# Patient Record
Sex: Female | Born: 1980 | Race: Black or African American | Hispanic: No | Marital: Married | Smoking: Current every day smoker
Health system: Southern US, Community
[De-identification: ages and names within clinical notes are randomized; demographics above are authoritative.]

## PROBLEM LIST (undated history)

## (undated) ENCOUNTER — Inpatient Hospital Stay (HOSPITAL_COMMUNITY): Payer: Self-pay

## (undated) DIAGNOSIS — I10 Essential (primary) hypertension: Secondary | ICD-10-CM

## (undated) DIAGNOSIS — L732 Hidradenitis suppurativa: Secondary | ICD-10-CM

## (undated) DIAGNOSIS — G47419 Narcolepsy without cataplexy: Secondary | ICD-10-CM

## (undated) DIAGNOSIS — G4730 Sleep apnea, unspecified: Secondary | ICD-10-CM

## (undated) DIAGNOSIS — R87629 Unspecified abnormal cytological findings in specimens from vagina: Secondary | ICD-10-CM

## (undated) DIAGNOSIS — E039 Hypothyroidism, unspecified: Secondary | ICD-10-CM

## (undated) DIAGNOSIS — A4902 Methicillin resistant Staphylococcus aureus infection, unspecified site: Secondary | ICD-10-CM

## (undated) DIAGNOSIS — N390 Urinary tract infection, site not specified: Secondary | ICD-10-CM

## (undated) DIAGNOSIS — E559 Vitamin D deficiency, unspecified: Secondary | ICD-10-CM

## (undated) HISTORY — PX: GANGLION CYST EXCISION: SHX1691

## (undated) HISTORY — PX: CHOLECYSTECTOMY: SHX55

## (undated) HISTORY — PX: CARPAL TUNNEL RELEASE: SHX101

## (undated) HISTORY — PX: AXILLARY HIDRADENITIS EXCISION: SUR522

## (undated) HISTORY — PX: TONSILLECTOMY: SUR1361

## (undated) HISTORY — PX: LAPAROSCOPIC GASTRIC BANDING: SHX1100

---

## 2003-09-25 ENCOUNTER — Other Ambulatory Visit: Payer: Self-pay

## 2004-04-23 ENCOUNTER — Emergency Department: Payer: Self-pay | Admitting: Emergency Medicine

## 2004-06-24 ENCOUNTER — Other Ambulatory Visit: Payer: Self-pay

## 2004-06-24 ENCOUNTER — Emergency Department: Payer: Self-pay | Admitting: Emergency Medicine

## 2004-08-24 ENCOUNTER — Emergency Department: Payer: Self-pay | Admitting: Emergency Medicine

## 2005-01-23 ENCOUNTER — Emergency Department: Payer: Self-pay | Admitting: Unknown Physician Specialty

## 2005-06-20 ENCOUNTER — Emergency Department: Payer: Self-pay | Admitting: Emergency Medicine

## 2005-12-17 ENCOUNTER — Encounter: Payer: Self-pay | Admitting: Pulmonary Disease

## 2006-09-14 ENCOUNTER — Other Ambulatory Visit: Admission: RE | Admit: 2006-09-14 | Discharge: 2006-09-14 | Payer: Self-pay | Admitting: Gynecology

## 2006-11-19 ENCOUNTER — Encounter: Payer: Self-pay | Admitting: Pulmonary Disease

## 2006-11-20 ENCOUNTER — Encounter: Payer: Self-pay | Admitting: Pulmonary Disease

## 2007-02-15 ENCOUNTER — Other Ambulatory Visit: Admission: RE | Admit: 2007-02-15 | Discharge: 2007-02-15 | Payer: Self-pay | Admitting: Gynecology

## 2007-07-23 ENCOUNTER — Other Ambulatory Visit: Admission: RE | Admit: 2007-07-23 | Discharge: 2007-07-23 | Payer: Self-pay | Admitting: Gynecology

## 2007-11-30 ENCOUNTER — Ambulatory Visit: Payer: Self-pay | Admitting: Pulmonary Disease

## 2007-11-30 DIAGNOSIS — G4726 Circadian rhythm sleep disorder, shift work type: Secondary | ICD-10-CM | POA: Insufficient documentation

## 2007-11-30 DIAGNOSIS — G4710 Hypersomnia, unspecified: Secondary | ICD-10-CM | POA: Insufficient documentation

## 2007-11-30 DIAGNOSIS — G4733 Obstructive sleep apnea (adult) (pediatric): Secondary | ICD-10-CM

## 2007-12-01 ENCOUNTER — Encounter: Payer: Self-pay | Admitting: Pulmonary Disease

## 2007-12-13 ENCOUNTER — Encounter: Payer: Self-pay | Admitting: Pulmonary Disease

## 2007-12-20 ENCOUNTER — Other Ambulatory Visit: Admission: RE | Admit: 2007-12-20 | Discharge: 2007-12-20 | Payer: Self-pay | Admitting: Gynecology

## 2008-01-14 ENCOUNTER — Encounter (INDEPENDENT_AMBULATORY_CARE_PROVIDER_SITE_OTHER): Payer: Self-pay | Admitting: *Deleted

## 2008-02-15 ENCOUNTER — Encounter: Payer: Self-pay | Admitting: Pulmonary Disease

## 2008-03-23 ENCOUNTER — Ambulatory Visit: Payer: Self-pay | Admitting: Infectious Diseases

## 2008-03-23 DIAGNOSIS — L0291 Cutaneous abscess, unspecified: Secondary | ICD-10-CM | POA: Insufficient documentation

## 2008-03-23 DIAGNOSIS — L0390 Cellulitis, unspecified: Secondary | ICD-10-CM

## 2008-03-24 ENCOUNTER — Encounter: Payer: Self-pay | Admitting: Infectious Diseases

## 2008-05-05 ENCOUNTER — Inpatient Hospital Stay (HOSPITAL_COMMUNITY): Admission: EM | Admit: 2008-05-05 | Discharge: 2008-05-11 | Payer: Self-pay | Admitting: Internal Medicine

## 2008-05-10 ENCOUNTER — Encounter (INDEPENDENT_AMBULATORY_CARE_PROVIDER_SITE_OTHER): Payer: Self-pay | Admitting: General Surgery

## 2008-06-13 ENCOUNTER — Ambulatory Visit: Payer: Self-pay | Admitting: Infectious Diseases

## 2008-06-13 DIAGNOSIS — L723 Sebaceous cyst: Secondary | ICD-10-CM

## 2009-02-05 ENCOUNTER — Inpatient Hospital Stay (HOSPITAL_COMMUNITY): Admission: EM | Admit: 2009-02-05 | Discharge: 2009-02-07 | Payer: Self-pay | Admitting: Internal Medicine

## 2009-08-17 ENCOUNTER — Ambulatory Visit: Payer: Self-pay | Admitting: Physician Assistant

## 2009-08-17 ENCOUNTER — Inpatient Hospital Stay (HOSPITAL_COMMUNITY): Admission: AD | Admit: 2009-08-17 | Discharge: 2009-08-17 | Payer: Self-pay | Admitting: Obstetrics and Gynecology

## 2009-09-28 ENCOUNTER — Ambulatory Visit: Payer: Self-pay | Admitting: Gynecology

## 2009-09-28 ENCOUNTER — Inpatient Hospital Stay (HOSPITAL_COMMUNITY): Admission: AD | Admit: 2009-09-28 | Discharge: 2009-09-28 | Payer: Self-pay | Admitting: Obstetrics and Gynecology

## 2009-10-12 ENCOUNTER — Encounter: Payer: Self-pay | Admitting: Obstetrics and Gynecology

## 2009-10-12 ENCOUNTER — Inpatient Hospital Stay (HOSPITAL_COMMUNITY): Admission: RE | Admit: 2009-10-12 | Discharge: 2009-10-15 | Payer: Self-pay | Admitting: Obstetrics and Gynecology

## 2009-10-25 ENCOUNTER — Encounter (HOSPITAL_BASED_OUTPATIENT_CLINIC_OR_DEPARTMENT_OTHER): Admission: RE | Admit: 2009-10-25 | Discharge: 2009-12-19 | Payer: Self-pay | Admitting: General Surgery

## 2009-11-13 ENCOUNTER — Emergency Department (HOSPITAL_BASED_OUTPATIENT_CLINIC_OR_DEPARTMENT_OTHER): Admission: EM | Admit: 2009-11-13 | Discharge: 2009-11-13 | Payer: Self-pay | Admitting: Emergency Medicine

## 2010-01-10 ENCOUNTER — Emergency Department (HOSPITAL_BASED_OUTPATIENT_CLINIC_OR_DEPARTMENT_OTHER): Admission: EM | Admit: 2010-01-10 | Discharge: 2010-01-10 | Payer: Self-pay | Admitting: Emergency Medicine

## 2010-04-30 ENCOUNTER — Emergency Department (HOSPITAL_BASED_OUTPATIENT_CLINIC_OR_DEPARTMENT_OTHER)
Admission: EM | Admit: 2010-04-30 | Discharge: 2010-04-30 | Disposition: A | Discharge status: Home / Self Care | Payer: BC Managed Care – PPO | Attending: Emergency Medicine | Admitting: Emergency Medicine

## 2010-04-30 DIAGNOSIS — G43909 Migraine, unspecified, not intractable, without status migrainosus: Secondary | ICD-10-CM | POA: Insufficient documentation

## 2010-04-30 DIAGNOSIS — R209 Unspecified disturbances of skin sensation: Secondary | ICD-10-CM | POA: Insufficient documentation

## 2010-04-30 DIAGNOSIS — I10 Essential (primary) hypertension: Secondary | ICD-10-CM | POA: Insufficient documentation

## 2010-05-07 LAB — URINALYSIS, ROUTINE W REFLEX MICROSCOPIC
Ketones, ur: NEGATIVE mg/dL
Protein, ur: NEGATIVE mg/dL

## 2010-05-07 LAB — PREGNANCY, URINE: Preg Test, Ur: NEGATIVE

## 2010-05-07 LAB — URINE MICROSCOPIC-ADD ON

## 2010-05-10 LAB — COMPREHENSIVE METABOLIC PANEL
ALT: 14 U/L (ref 0–35)
AST: 19 U/L (ref 0–37)
Albumin: 2.9 g/dL — ABNORMAL LOW (ref 3.5–5.2)
Alkaline Phosphatase: 78 U/L (ref 39–117)
Calcium: 9.1 mg/dL (ref 8.4–10.5)
Creatinine, Ser: 0.59 mg/dL (ref 0.4–1.2)
GFR calc Af Amer: >60 mL/min (ref >60)
GFR calc non Af Amer: >60 mL/min (ref >60)
Glucose, Bld: 81 mg/dL (ref 70–99)
Potassium: 4 mEq/L (ref 3.5–5.1)
Total Bilirubin: 0.5 mg/dL (ref 0.3–1.2)
Total Protein: 6.2 g/dL (ref 6.0–8.3)

## 2010-05-10 LAB — CBC
HCT: 32.8 % — ABNORMAL LOW (ref 36.0–46.0)
Hemoglobin: 11.5 g/dL — ABNORMAL LOW (ref 12.0–15.0)
MCH: 28.5 pg (ref 26.0–34.0)
MCHC: 35.1 g/dL (ref 30.0–36.0)
Platelets: 144 10*3/uL — ABNORMAL LOW (ref 150–400)
RDW: 13.8 % (ref 11.5–15.5)
RDW: 14.2 % (ref 11.5–15.5)

## 2010-05-10 LAB — LACTATE DEHYDROGENASE: LDH: 140 U/L (ref 94–250)

## 2010-05-10 LAB — RPR: RPR Ser Ql: NONREACTIVE

## 2010-05-10 LAB — URIC ACID: Uric Acid, Serum: 4.3 mg/dL (ref 2.4–7.0)

## 2010-05-12 LAB — URINALYSIS, ROUTINE W REFLEX MICROSCOPIC
Nitrite: NEGATIVE
Protein, ur: NEGATIVE mg/dL
Specific Gravity, Urine: 1.02 (ref 1.005–1.030)
pH: 6 (ref 5.0–8.0)

## 2010-05-28 LAB — CBC
HCT: 36.3 % (ref 36.0–46.0)
HCT: 37.2 % (ref 36.0–46.0)
Hemoglobin: 12.6 g/dL (ref 12.0–15.0)
MCHC: 33.8 g/dL (ref 30.0–36.0)
MCHC: 33.9 g/dL (ref 30.0–36.0)
MCV: 80.1 fL (ref 78.0–100.0)
MCV: 80.2 fL (ref 78.0–100.0)
RBC: 4.42 MIL/uL (ref 3.87–5.11)
RBC: 4.53 MIL/uL (ref 3.87–5.11)
WBC: 10.8 10*3/uL — ABNORMAL HIGH (ref 4.0–10.5)

## 2010-05-28 LAB — TSH: TSH: 6.966 u[IU]/mL — ABNORMAL HIGH (ref 0.350–4.500)

## 2010-05-28 LAB — BASIC METABOLIC PANEL
BUN: 9 mg/dL (ref 6–23)
CO2: 25 mEq/L (ref 19–32)
Chloride: 109 mEq/L (ref 96–112)
Chloride: 110 mEq/L (ref 96–112)
Creatinine, Ser: 1 mg/dL (ref 0.4–1.2)
GFR calc Af Amer: >60 mL/min (ref >60)
GFR calc Af Amer: >60 mL/min (ref >60)
Potassium: 4.2 mEq/L (ref 3.5–5.1)
Sodium: 139 mEq/L (ref 135–145)

## 2010-05-28 LAB — COMPREHENSIVE METABOLIC PANEL
ALT: 11 U/L (ref 0–35)
Albumin: 3.4 g/dL — ABNORMAL LOW (ref 3.5–5.2)
BUN: 9 mg/dL (ref 6–23)
Creatinine, Ser: 0.85 mg/dL (ref 0.4–1.2)
GFR calc Af Amer: >60 mL/min (ref >60)
Glucose, Bld: 112 mg/dL — ABNORMAL HIGH (ref 70–99)
Sodium: 137 mEq/L (ref 135–145)

## 2010-05-28 LAB — SEDIMENTATION RATE: Sed Rate: 32 mm/hr — ABNORMAL HIGH (ref 0–22)

## 2010-05-28 LAB — DIFFERENTIAL
Eosinophils Absolute: 0.5 10*3/uL (ref 0.0–0.7)
Eosinophils Relative: 4 % (ref 0–5)
Lymphocytes Relative: 37 % (ref 12–46)
Lymphs Abs: 4.2 10*3/uL — ABNORMAL HIGH (ref 0.7–4.0)
Monocytes Absolute: 0.5 10*3/uL (ref 0.1–1.0)
Neutro Abs: 6.2 10*3/uL (ref 1.7–7.7)
Neutrophils Relative %: 54 % (ref 43–77)

## 2010-06-06 LAB — BASIC METABOLIC PANEL
BUN: 10 mg/dL (ref 6–23)
BUN: 9 mg/dL (ref 6–23)
CO2: 23 mEq/L (ref 19–32)
Chloride: 105 mEq/L (ref 96–112)
Creatinine, Ser: 1.01 mg/dL (ref 0.4–1.2)
GFR calc non Af Amer: >60 mL/min (ref >60)
Glucose, Bld: 86 mg/dL (ref 70–99)
Glucose, Bld: 87 mg/dL (ref 70–99)
Potassium: 3.4 mEq/L — ABNORMAL LOW (ref 3.5–5.1)

## 2010-06-06 LAB — CBC
HCT: 36.5 % (ref 36.0–46.0)
Hemoglobin: 12.3 g/dL (ref 12.0–15.0)
MCHC: 33.8 g/dL (ref 30.0–36.0)
MCV: 77.7 fL — ABNORMAL LOW (ref 78.0–100.0)
MCV: 78 fL (ref 78.0–100.0)
MCV: 78.5 fL (ref 78.0–100.0)
Platelets: 188 10*3/uL (ref 150–400)
Platelets: 211 10*3/uL (ref 150–400)
Platelets: 232 10*3/uL (ref 150–400)
RDW: 13.8 % (ref 11.5–15.5)
RDW: 15 % (ref 11.5–15.5)
RDW: 15 % (ref 11.5–15.5)
WBC: 10.4 10*3/uL (ref 4.0–10.5)
WBC: 8.4 10*3/uL (ref 4.0–10.5)

## 2010-06-06 LAB — COMPREHENSIVE METABOLIC PANEL
AST: 15 U/L (ref 0–37)
AST: 16 U/L (ref 0–37)
Albumin: 3.1 g/dL — ABNORMAL LOW (ref 3.5–5.2)
Albumin: 3.7 g/dL (ref 3.5–5.2)
Calcium: 9.1 mg/dL (ref 8.4–10.5)
Chloride: 107 mEq/L (ref 96–112)
Chloride: 108 mEq/L (ref 96–112)
Creatinine, Ser: 1.06 mg/dL (ref 0.4–1.2)
Creatinine, Ser: 1.06 mg/dL (ref 0.4–1.2)
GFR calc Af Amer: >60 mL/min (ref >60)
GFR calc Af Amer: >60 mL/min (ref >60)
Potassium: 4.6 mEq/L (ref 3.5–5.1)
Total Bilirubin: 0.6 mg/dL (ref 0.3–1.2)
Total Bilirubin: 0.7 mg/dL (ref 0.3–1.2)
Total Protein: 6.5 g/dL (ref 6.0–8.3)
Total Protein: 7.4 g/dL (ref 6.0–8.3)

## 2010-07-09 NOTE — Consult Note (Signed)
NAMEPRISCELLA, [name] [name]              ACCOUNT NO.:  [id number]   MEDICAL RECORD NO.:  [id number]          PATIENT TYPE:  INP   LOCATION:  1534                         FACILITY:  [facility]   PHYSICIAN:  John C. Madilyn Fireman, M.D.    DATE OF BIRTH:  11/21/1980   DATE OF CONSULTATION:  DATE OF DISCHARGE:                                 CONSULTATION   REFERRING PHYSICIAN:  Jackie Plum, M.D.   REASON FOR ADMISSION:  We were asked to see Ms. Dockham today in  consultation for right lower quadrant pain by Dr. Greggory Stallion Osei-Bonsu.   HISTORY OF PRESENT ILLNESS:  This is a very pleasant 30 year old female  who was admitted on May 05, 2008 with right lower quadrant pain and  was ruled out for appendicitis with a CT scan.  The patient reports that  her pain started on Thursday and it was continuous in nature, it  radiates up from her right lower quadrant to her right upper quadrant  and sometimes goes through to her back.  The patient vomited on Friday  and Saturday.  She denies any blood in her emesis.  Her stools are  regular and daily and dark brown in color.  She tells me that her  abdominal pain is worse on inspiration, but better with pressure against  her right side.  She takes an occasional Aleve.  She is on doxycycline  chronically for boils on her abdomen.   PAST MEDICAL HISTORY:  Significant for:  1. Hypertension.  2. Gastroesophageal reflux disease.  3. A right ovarian cyst that she had surgically removed.   CURRENT MEDICATIONS:  Doxycycline, Butalbital, Atenolol, she calls it a  GERD pill, although I think  it is Prilosec, hydrocodone, hydroxyzine,  fexofenadine.   She has an allergy to PENICILLIN and LEXAPRO.   REVIEW OF SYSTEMS:  Significant for chronic headaches.   SOCIAL HISTORY:  Negative for drugs, alcohol and tobacco.  She is  married.  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   FAMILY HISTORY:  Negative for IB, liver disease or colon cancer.  Her  maternal  grandmother does have an ulcer.   PHYSICAL EXAM:  She is alert and oriented, obese.  Temperature 99, pulse 65, respirations 18, blood pressure 101/68, heart  has a regular rate and rhythm.  LUNGS:  Clear.  ABDOMEN:  Obese, nondistended, tender in the right upper quadrant and  right lower quadrant.  She has good bowel sounds.  No obvious  organomegaly.  No peritoneal signs or rebound. Her lower abdomen does  have multiple boils in various stages of healing.   LABS:  Show a hemoglobin of 12.6, hematocrit 36.0, white count 8.4,  platelets 188,000.  Her BUN is 8, creatinine 1.06, potassium 4.6,  glucose 93.  LFTs are within normal range.  CT of her abdomen and pelvis  done May 05, 2008 was negative.   ASSESSMENT:  Dr. Dorena Cookey has seen and examined the patient, collected  a history and reviewed her chart.  His impression is that this is a 20-  year-old obese female with right lower quadrant  pain for the past 4  days.  We will check an abdominal and pelvic ultrasound to rule out  gallbladder disease or any more ovarian cysts.  Would consider a HIDA  scan if the abdominal ultrasound is negative.  She does not appear to  need urgent colonoscopy or endoscopy and could do these as an outpatient  if they need to be done in the future.  Thanks very much for this  consultation.  We will follow with you.      Stephani Police, PA    ______________________________  Everardo All Madilyn Fireman, M.D.    MLY/MEDQ  D:  05/08/2008  T:  05/08/2008  Job:  [id number]   cc:   Jackie Plum, M.D.  Fax: [phone]   Everardo All. Madilyn Fireman, M.D.  Fax: [phone]

## 2010-07-09 NOTE — Consult Note (Signed)
[name], [name]              ACCOUNT NO.:  [id number]   MEDICAL RECORD NO.:  [id number]          PATIENT TYPE:  INP   LOCATION:  1534                         FACILITY:  [facility]   PHYSICIAN:  Angelia Mould. Derrell Lolling, M.D.DATE OF BIRTH:  1981-01-30   DATE OF CONSULTATION:  05/09/2008  DATE OF DISCHARGE:                                 CONSULTATION   REASON FOR CONSULTATION:  Evaluate gallstones and abdominal pain.   HISTORY OF PRESENT ILLNESS:  This is a 30 year old black female who was  admitted to this hospital by Dr. Jackie Plum on May 05, 2008  because of abdominal pain and vomiting.   The patient states that she developed right-sided abdominal pain 5 days  ago.  She said it occurred in the morning before breakfast and was  rather sharp, she felt like she was being stabbed.  She endured the pain  throughout the first day.  The second day of the pain, 4 days ago, she  vomited three times.  Rosealee Albee she did not get better she went to see Dr.  Greggory Stallion Osei-Bonsu.  He was concerned about appendicitis and admitted her  to the hospital.  CT scan showed the appendix to be normal in all  regards and no other abnormalities were noted.  He started her on  antibiotics.   Since that time she has had pelvic ultrasound which showed no dramatic  abnormalities.  She has been seen by Dr. Dorena Cookey and Dr. Charlott Rakes.  She has had a gallbladder ultrasound, which shows small  gallstones within the gallbladder but the gallbladder is not thickened  and they said she had a negative sonographic Murphy's sign.  She then  had a hepatobiliary scan this morning which showed that the gallbladder  filled within 15 minutes but had a delayed and depressed ejection  fraction of 16.8% which was thought should have been greater than 30%.   The patient states that her pain has always been more in the right upper  than the right lower quadrant.  She states that she still has the pain,  although it is  better.  The nausea has resolved.  She ate a full lunch  today.  She denies any prior similar problems.  No history of liver  disease, pulmonary disease, cardiac disease or urologic problems.  I was  called to evaluate her.   PAST HISTORY:  1. Hypertension.  2. Morbid obesity.  3. Gastroesophageal reflux disease.  4. Laparoscopic ovarian cystectomy.  5. Prior abdominal wall soft tissue infections, none currently.   CURRENT MEDICATIONS:  1. Neurontin 600 mg b.i.d.  2. Tenormin 50 mg daily.  3. Hygroton 25 mg daily.  4. Potassium chloride.  5. Lovenox 80 mg daily.  6. Doxycycline 1 mg daily.  7. Protonix 40 mg daily p.o.  8. Toradol 30 mg every 8 hours p.r.n. pain.  9. Hydromorphone 2 mg IV p.r.n. pain.  10.Claritin mg daily, p.r.n.  11.Atarax 50 mg four times a day p.r.n.  12.Vicodin.   MEDICATION ALLERGIES:  PENICILLINS, LEXAPRO, FISH AND PINEAPPLE.   SOCIAL HISTORY:  Negative for drugs,  alcohol, tobacco.  She is married.  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   FAMILY HISTORY:  Negative for inflammatory bowel disease, liver disease  or colon cancer.  Paternal grandmother does have an ulcer.   REVIEW OF SYSTEMS:  She has been having normal bowel movements.  Denies  diarrhea.  Denies any history of jaundice or liver disease.  She said  that she weighs 350 pounds.   PHYSICAL EXAM:  Morbidly obese black female who appears to be in minimal  distress.  She is alert and oriented and cooperative.  Temperature 98.5.  Blood pressure 115/76, pulse 80, respiratory rate 20, oxygen saturation  100% on room air.  EYES:  Sclerae clear.  Extraocular movements intact.  EARS, NOSE, MOUTH AND THROAT:  Nose, lips, tongue and oropharynx are  without gross lesions.  NECK:  Supple, nontender.  No mass.  No jugular distention.  LUNGS:  Clear to auscultation.  No costovertebral angle tenderness.  HEART:  Regular rate and rhythm.  No murmurs.  Radial and femoral pulses  are  palpable.  ABDOMEN:  Morbidly obese.  She is tender in the right upper quadrant  much more than the right lower quadrant.  She guards but it seems more  voluntary than involuntary.  She has hypoactive bowel sounds.  She does  not appear obviously distended.  She has multiple scars on her lower  abdomen which appear to be from previous soft tissue infections.  I do  not see any active infection at this time.  EXTREMITIES:  She moves all four extremities well without pain or  deformity.  NEUROLOGIC:  No gross motor sensory deficits.   ADMISSION DATA:  Her CT scan, gallbladder ultrasound and hepatobiliary  scan are discussed above.  Urine pregnancy test is negative.  Complete  metabolic panel is normal essentially.  Albumin 3.1.  CBC reveals white  blood cell count 8400 and hemoglobin of 12.6.   ASSESSMENT:  1. Right upper quadrant pain and gallstones.  Her physical findings      seem to be out of proportion to her laboratory and radiographic      findings, but there does not appear to be any other good reason to      explain her symptoms other than gallstones.  I suspect that it is      most likely her gallbladder causing her symptoms. I cannot rule out      an obscure cause of pain such as infarcted omentum, perihepatic      adhesions, or ruptured ovarian cyst (doubt).  2. I do not think there is any evidence of acute cholecystitis by      ultrasound or hepatobiliary scan.  3. Morbid obesity.  4. Status post laparoscopic ovarian cystectomy.  5. Prior abdominal wall soft tissue infections.   PLAN:  1. I had a long discussion with the patient about the fact that most      likely this was due to her gallbladder, but I could not be certain.      I told her that we really did not have any other good explanation      for this and that probably she should have a cholecystectomy at      some point.  2. It was her strong desire to stay in the hospital and have the      cholecystectomy  because she states that she still has pain,      although her appetite has returned  and the nausea has resolved.   For this reason we are going to go ahead and try to get her on the  schedule for laparoscopic cholecystectomy with cholangiogram, possible  conversion to open cholecystectomy tomorrow afternoon.   I have discussed the indications and details of surgery with her.  Risks  and complications have been outlined, including but not limited to,  bleeding, infection, conversion to open laparotomy, injury to adjacent  organs such as the stomach or colon or liver with major reconstructive  surgery, bile leak, wound problems, cardiac, pulmonary and  thromboembolic problems.  She seems to understand these issues well.  At  this time all of her questions are answered.  She is in full agreement  with this plan.   I have discussed her care with Dr. Greggory Stallion Osei-Bonsu and he agrees as  well.      Angelia Mould. Derrell Lolling, M.D.  Electronically Signed     HMI/MEDQ  D:  05/09/2008  T:  05/09/2008  Job:  [id number]   cc:   Shirley Friar, MD  Fax: [phone]   Jackie Plum, M.D.  Fax: [phone]

## 2010-07-09 NOTE — Op Note (Signed)
[name], [name]              ACCOUNT NO.:  [id number]   MEDICAL RECORD NO.:  [id number]          PATIENT TYPE:  INP   LOCATION:  1534                         [facility]ACILITY:  [facility]   PHYSICIAN:  Angelia Mould. Derrell Lolling, M.D.DATE OF BIRTH:  02-23-1981   DATE OF PROCEDURE:  05/10/2008  DATE OF DISCHARGE:                               OPERATIVE REPORT   PREOPERATIVE DIAGNOSES:  Gallstones and biliary dyskinesia.   POSTOPERATIVE DIAGNOSES:  1. Gallstones and biliary dyskinesia.  2. Infarcted omentum.   OPERATION PERFORMED:  Laparoscopic cholecystectomy, laparoscopic partial  omentectomy.   SURGEON:  Dr. Claud Kelp   FIRST ASSISTANT:  Dr. Consuello Bossier.   OPERATIVE INDICATIONS:  This is a 30 year old black female who was  admitted to this hospital on March 12 because abdominal pain and  vomiting.  This started 5 days before today, occurred somewhat suddenly,  it felt she was being stabbed, with vomiting three times on the first  day of the pain.  She had a CT scan the next day because of concern for  appendicitis but there was no evidence of appendicitis or abnormality  noted on the CT scan.  She was admitted and started on antibiotics and  pain medication.  A pelvic ultrasound showed no dramatic abnormalities.  Gallbladder ultrasound showed small stones within the gallbladder but no  wall thickening.  Hepatobiliary scan showed the gallbladder filled but  had a significantly depressed ejection fraction.  I was asked to see the  patient.  By the time I saw her yesterday, she was enjoying lunch but  still complained of significant pain in the right upper quadrant.  On  exam, she did not look toxic or ill but did have tenderness in the right  upper quadrant.  I felt that her pain and tenderness was out of  proportion to her laboratory and radiographic findings, but had no  better explanation and so offered laparoscopy and cholecystectomy.  The  patient said that the pain was fairly  significant and really did not  want to go home.  She is brought to operating room today after being  n.p.o. after midnight.   OPERATIVE FINDINGS:  The gallbladder did look somewhat chronically  inflamed, was a little bit thick-walled.  The cystic duct was very tiny  and I really could not intubate with the cholangiogram catheter and so  had to abandon the cholangiogram.  The most interesting finding was  about a 3 cm x6 cm piece of clearly infarcted omentum in the right upper  quadrant.  There was no evidence of any other infarction of omentum or  any other abnormality of the abdomen and pelvis that I could detect.  I  felt that infarction omentum might be more likely be the cause of her  pain than her gallbladder.   OPERATIVE TECHNIQUE:  Following the induction of general endotracheal  anesthesia the patient's abdomen was prepped and draped in a sterile  fashion.  Intravenous antibiotics were given.  The surgical time-out was  held identifying correct patient and correct procedure.  0.5% Marcaine  with epinephrine was used as a local  infiltration anesthetic.   A 12-mm optical port was placed in the right rectus sheath about 4 cm  above the umbilicus.  This entry was uneventful, atraumatic and without  bleeding.  Pneumoperitoneum was created.  Video cam was inserted with  findings as described above.  An 11 mm port was placed the subxiphoid  region and two 5 mm ports placed in the right upper quadrant.  We  inspected the liver and gallbladder.  We inspected the area of infarcted  omentum.  We inspected the left upper quadrant and the left lobe of the  liver, stomach and spleen looked fine.  We inspected the lower abdomen  and pelvis and the omentum looked fine but was quite large and obscured  the rest of the GI tract partly.   We elevated the infarcted omentum and took photographs of it.  We found  that there was a couple of attachments of clearly healthy omentum to  this and we  divided these using the harmonic scalpel and then placed the  infarcted piece of omentum in a specimen bag and removed and sent it to  the lab.  There was no bleeding.   We then grabbed the gallbladder and elevated it.  There were moderate  adhesions to the body and infundibulum of the gallbladder which we took  down.  We then dissected out the cystic duct and cystic artery.  The  cystic artery was secured with multiple metal clips and divided.  We  isolated the cystic duct.  We made two separate areas of incision in the  cystic duct and tried to intubated with a cholangiogram catheter.  The  lumen was very narrow and tiny and we could not do that and so we  abandoned the cholangiogram, secured the cystic duct with four metal  clips and divided it.  The gallbladder was dissected from its bed with  electrocautery,  placed  in a specimen bag and removed from the  operative field.  We made one hole in the gallbladder and spilled some  bile but no stones.  After removing the gallbladder, we irrigated the  right upper quadrant, right sympathic space and right subphrenic space  with about 2 liters of saline until the irrigation fluid was completely  clear.  We inspected the bed of the gallbladder.  There was no bleeding.  We inspected the area of the control of the cystic duct and artery.  There was no bleeding or bile whatsoever.  The remaining omentum was  placed back up in the gallbladder bed.  The trocars were removed under  direct vision.  Pneumoperitoneum was released.  The skin incisions were  closed with subcuticular sutures of 4-0 Monocryl and Steri-Strips.  Clean bandages were placed and the patient taken recovery room in stable  condition.  Estimated blood loss was about 15-20 mL.  Complications  none.  Sponge, needles counts were correct.   SUMMARY:  Shunt the spleen thanks      Haywood M. Derrell Lolling, M.D.  Electronically Signed     HMI/MEDQ  D:  05/10/2008  T:  05/11/2008   Job:  [id number]   cc:   Shirley Friar, MD  Fax: [phone]   Jackie Plum, M.D.  Fax: [phone]

## 2010-07-09 NOTE — Discharge Summary (Signed)
[name] [name], [name] [name]              ACCOUNT NO.:  [id number]   MEDICAL RECORD NO.:  [id number]          PATIENT TYPE:  INP   LOCATION:  1534                         FACILITY:  [facility]   PHYSICIAN:  Jackie Plum, M.D.DATE OF BIRTH:  [birth year]   DATE OF ADMISSION:  05/05/2008  DATE OF DISCHARGE:  05/11/2008                               DISCHARGE SUMMARY   DISCHARGE DIAGNOSES:  1. Abdominal pain due to combination of chronic cholecystitis and      omental infarction, status post laparoscopic cholecystectomy and      laparoscopic partial omentectomy by Dr. Claud Kelp on May 10, 2008.  2. History of migraine headaches.  3. Hypertension.  4. Gastroesophageal reflux disease.  5. Morbid obesity.  6. Status post cholecystectomy and status post ovarian cyst removal.   HOSPITAL COURSE:  The patient is a 30 year old African American lady who  was admitted to the hospital on May 05, 2008 after presenting with  abdominal pain which was initially felt to be right lower quadrant in  origin but subsequently was noted to involve also the right upper  quadrant area.  There was no nausea or vomiting.  The patient was  admitted to the hospital and a CT scan was obtained which ruled out  concern for appendicitis.  She was seen by Dr. Madilyn Fireman of [facility] and ultrasound was done.  The ultrasound showed  polycystic ovaries.  This was followed up by a HIDA scan which was  borderline positive.  General surgical consult as obtained on May 09, 2008 and after careful review of the patient's tests, there was no clear  etiology for her symptoms and possibly secondary to gallstones that were  noted in her ultrasound report.  The patient was therefore sent for  laparoscopic surgery and was noted to have chronic cholecystitis and a  partial omental infarction.  The patient underwent cholecystectomy and  partial omentectomy with significant improvement overnight.  At this  point the  patient is pain free from her abdominal pain with no upper GI  symptoms and she is tolerating her meals when I saw her this morning.  She is hemodynamically stable with blood pressure of 110/70, pulse of 96  by radial pulse, respirations 18, temperature of 99.5 as well as oxygen  saturation of 98% on room air.  The patient is comfortable, she is  feeling better and ready for discharge home today.  She is now being  discharged with __________present.  Neck supple. No JVD.  Lungs clear to  auscultation.  Cardiac regular, no gallops.  Abdomen laparoscopic  incision points noted with no evidence of secondary infection.  Abdomen  is soft with bowel sounds present.  Mild tenderness around the  __________area.  There was benign __________present.  Normal bowel  sounds.  Extremities have trace edema, no cyanosis.  Patient is alert  and oriented x3 and she is discharged in satisfactory condition today.   DISCHARGE LABORATORY DATA:  White count 8.4, hemoglobin 12.6, hematocrit  36, MCV 79.7, platelet count 188,000.  Sodium 138, potassium 4.6,  chloride 107, CO2  28, glucose 93, BUN 8, creatinine 1.06.  Alkaline  phosphatase 33, AST 16, ALT 14, total protein 6.5, albumin 3.1, calcium  8.6.   DISCHARGE MEDICATIONS:  The patient is going to resume the following  admission medications as previously:  1. Gabapentin 600 mg b.i.d.  2. Pepcid 20 mg b.i.d.  3. Atenolol/chlorthalidone 50/75, one tablet daily.  4. Protonix 40 mg daily.  Her new medications are:  1. Cipro 400 mg p.o. q.12h. for 7 days.  2. Vicodin 5/500 one tablet q.4h. p.r.n.  3. Atarax 50 mg q.8h. p.r.n.  4. Claritin 10 mg daily p.r.n.   DISPOSITION:  Patient is discharged in stable, satisfactory condition.   FOLLOWUP:  She will follow up with Dr. Derrell Lolling in 3 weeks.  Follow with  Dr. Julio Sicks at [facility] in 2 weeks.      Jackie Plum, M.D.  Electronically Signed     GO/MEDQ  D:  05/11/2008  T:  05/11/2008   Job:  [id number]   cc:   Angelia Mould. Derrell Lolling, M.D.  [address]  [place]  [place] [address]   Everardo All. Madilyn Fireman, M.D.  Fax: [phone]

## 2010-09-30 ENCOUNTER — Emergency Department (HOSPITAL_BASED_OUTPATIENT_CLINIC_OR_DEPARTMENT_OTHER)
Admission: EM | Admit: 2010-09-30 | Discharge: 2010-09-30 | Disposition: A | Discharge status: Home / Self Care | Payer: BC Managed Care – PPO | Attending: Emergency Medicine | Admitting: Emergency Medicine

## 2010-09-30 ENCOUNTER — Encounter: Payer: Self-pay | Admitting: *Deleted

## 2010-09-30 DIAGNOSIS — G43909 Migraine, unspecified, not intractable, without status migrainosus: Secondary | ICD-10-CM | POA: Insufficient documentation

## 2010-09-30 DIAGNOSIS — R51 Headache: Secondary | ICD-10-CM

## 2010-09-30 MED ORDER — KETOROLAC TROMETHAMINE 60 MG/2ML IM SOLN: 60.0000 mg | Freq: Once | INTRAMUSCULAR | Status: DC

## 2010-09-30 MED ORDER — PROMETHAZINE HCL 25 MG/ML IJ SOLN
25.0000 mg | Freq: Once | INTRAMUSCULAR | Status: AC
  Administered 2010-09-30: 25 mg via INTRAVENOUS

## 2010-09-30 MED ORDER — KETOROLAC TROMETHAMINE 30 MG/ML IJ SOLN
30.0000 mg | Freq: Once | INTRAMUSCULAR | Status: AC
  Administered 2010-09-30: 30 mg via INTRAVENOUS

## 2010-09-30 MED ORDER — METHYLPREDNISOLONE SODIUM SUCC 125 MG IJ SOLR
250.0000 mg | Freq: Once | INTRAMUSCULAR | Status: AC
  Administered 2010-09-30: 250 mg via INTRAVENOUS
  Filled 2010-09-30: qty 4

## 2010-09-30 MED ORDER — DIPHENHYDRAMINE HCL 50 MG/ML IJ SOLN
25.0000 mg | Freq: Once | INTRAMUSCULAR | Status: AC
  Administered 2010-09-30: 25 mg via INTRAVENOUS

## 2010-09-30 MED ORDER — KETOROLAC TROMETHAMINE 30 MG/ML IJ SOLN
INTRAMUSCULAR | Status: AC
  Filled 2010-09-30: qty 1

## 2010-09-30 MED ORDER — DIPHENHYDRAMINE HCL 50 MG/ML IJ SOLN
25.0000 mg | Freq: Once | INTRAMUSCULAR | Status: DC
  Filled 2010-09-30: qty 1

## 2010-09-30 MED ORDER — PROMETHAZINE HCL 25 MG/ML IJ SOLN
25.0000 mg | Freq: Once | INTRAMUSCULAR | Status: DC
  Filled 2010-09-30: qty 1

## 2010-09-30 NOTE — ED Provider Notes (Addendum)
History     CSN: [id number] Arrival date & time: 09/30/2010  2:01 AM  Chief Complaint  Patient presents with  . Migraine   HPI Comments: Patient presents with a two-day history of headache. She does have a history of migraines and states that this is similar to her past migraines. She has some associated nausea but no vomiting. No fevers at home. No focal weakness or numbness or other neurologic deficits. She's tried her Topamax and her Maxalt home without relief. She previously been on Fioricet but it upsets her stomach so her doctor told her not to take it anymore. She has also used Vicodin in the past but did not have any at home at this time. Patient's headache goes from the top of her head down the posterior aspect of her head and neck. It is worsened by light.  Patient is a 30 y.o. female presenting with migraine. The history is provided by the patient.  Migraine This is a recurrent problem. The current episode started 2 days ago. The problem occurs constantly. The problem has not changed since onset.Associated symptoms include headaches. Pertinent negatives include no chest pain, no abdominal pain and no shortness of breath. The symptoms are relieved by nothing. The treatment provided no relief.    Past Medical History  Diagnosis Date  . Migraine     Past Surgical History  Procedure Date  . Laparoscopic gastric banding   . Tonsillectomy   . Cholecystectomy     No family history on file.  History  Substance Use Topics  . Smoking status: Not on file  . Smokeless tobacco: Not on file  . Alcohol Use:     OB History    Grav Para Term Preterm Abortions TAB SAB Ect Mult Living                  Review of Systems  Constitutional: Negative.  Negative for fever and chills.  Eyes: Negative.  Negative for discharge and redness.  Respiratory: Negative.  Negative for cough and shortness of breath.   Cardiovascular: Negative.  Negative for chest pain.  Gastrointestinal: Positive  for nausea. Negative for vomiting, abdominal pain and diarrhea.  Genitourinary: Negative.  Negative for dysuria and vaginal discharge.  Musculoskeletal: Negative.  Negative for back pain.  Skin: Negative.  Negative for color change and rash.  Neurological: Positive for headaches. Negative for syncope.  Hematological: Negative.  Negative for adenopathy.  Psychiatric/Behavioral: Negative.  Negative for confusion.  All other systems reviewed and are negative.    Physical Exam  BP 141/82  Pulse 77  Temp(Src) 97.8 F (36.6 C) (Oral)  Resp 18  SpO2 100%  Physical Exam  Constitutional: She is oriented to person, place, and time. She appears well-developed and well-nourished.  Non-toxic appearance. She does not have a sickly appearance.  HENT:  Head: Normocephalic and atraumatic.  Eyes: Conjunctivae, EOM and lids are normal. Pupils are equal, round, and reactive to light. No scleral icterus.  Neck: Trachea normal and normal range of motion. Neck supple.  Cardiovascular: Regular rhythm and normal heart sounds.   Pulmonary/Chest: Effort normal and breath sounds normal.  Abdominal: Soft. Normal appearance. There is no tenderness. There is no CVA tenderness.  Musculoskeletal: Normal range of motion.  Neurological: She is alert and oriented to person, place, and time. She has normal strength.  Skin: Skin is warm, dry and intact. No rash noted.  Psychiatric: She has a normal mood and affect. Her behavior is normal. Judgment and  thought content normal.    ED Course  Procedures  MDM Patient appears to have a headache that is typical for her with her migraines. She does not have signs of infection such as fevers to indicate meningitis. It was not a sudden onset headache to suggest subarachnoid hemorrhage. Will treat patient's pain and nausea and reassess her.       Nat Christen, MD 09/30/10 [phone]  Patient reassessed after the dose of Solu-Medrol was added to her previous medications  and she now has resolution of her headache and would like to go home at this time.  Nat Christen, MD 09/30/10 [phone]

## 2010-09-30 NOTE — ED Notes (Signed)
Pt began having migraine 2 days ago. Hx of migraines. Pt has taken topamax, maxalt.

## 2010-10-30 ENCOUNTER — Emergency Department (HOSPITAL_BASED_OUTPATIENT_CLINIC_OR_DEPARTMENT_OTHER)
Admission: EM | Admit: 2010-10-30 | Discharge: 2010-10-30 | Disposition: A | Discharge status: Home / Self Care | Payer: BC Managed Care – PPO | Attending: Emergency Medicine | Admitting: Emergency Medicine

## 2010-10-30 ENCOUNTER — Encounter (HOSPITAL_BASED_OUTPATIENT_CLINIC_OR_DEPARTMENT_OTHER): Payer: Self-pay | Admitting: *Deleted

## 2010-10-30 DIAGNOSIS — F17200 Nicotine dependence, unspecified, uncomplicated: Secondary | ICD-10-CM | POA: Insufficient documentation

## 2010-10-30 DIAGNOSIS — G43909 Migraine, unspecified, not intractable, without status migrainosus: Secondary | ICD-10-CM | POA: Insufficient documentation

## 2010-10-30 MED ORDER — KETOROLAC TROMETHAMINE 30 MG/ML IJ SOLN
30.0000 mg | Freq: Once | INTRAMUSCULAR | Status: AC
  Administered 2010-10-30: 30 mg via INTRAVENOUS
  Filled 2010-10-30: qty 1

## 2010-10-30 MED ORDER — METHYLPREDNISOLONE SODIUM SUCC 125 MG IJ SOLR
125.0000 mg | Freq: Once | INTRAMUSCULAR | Status: AC
  Administered 2010-10-30: 125 mg via INTRAVENOUS
  Filled 2010-10-30: qty 2

## 2010-10-30 MED ORDER — DIPHENHYDRAMINE HCL 50 MG/ML IJ SOLN
25.0000 mg | Freq: Once | INTRAMUSCULAR | Status: AC
  Administered 2010-10-30: 50 mg via INTRAVENOUS
  Filled 2010-10-30: qty 1

## 2010-10-30 MED ORDER — PROMETHAZINE HCL 25 MG/ML IJ SOLN
12.5000 mg | Freq: Once | INTRAMUSCULAR | Status: AC
  Administered 2010-10-30: 25 mg via INTRAVENOUS
  Filled 2010-10-30: qty 1

## 2010-10-30 NOTE — ED Notes (Signed)
Migraine with nausea and sensitivity to light/noise x3 days. Taking topamax and other home meds without relief.

## 2010-10-30 NOTE — ED Provider Notes (Signed)
History     CSN: [id number] Arrival date & time: 10/30/2010  1:44 AM  Chief Complaint  Patient presents with  . Migraine   HPI Pt with history of chronic migraines presents with typical migraine headache, severe throbbing R sided headache, nausea, and photophobia. Not controlled with home meds.  Past Medical History  Diagnosis Date  . Migraine     Past Surgical History  Procedure Date  . Laparoscopic gastric banding   . Tonsillectomy   . Cholecystectomy     No family history on file.  History  Substance Use Topics  . Smoking status: Current Everyday Smoker  . Smokeless tobacco: Not on file  . Alcohol Use: No    OB History    Grav Para Term Preterm Abortions TAB SAB Ect Mult Living                  Review of Systems All other systems reviewed and are negative except as noted in HPI.   Physical Exam  BP 127/82  Pulse 95  Temp(Src) 98.8 F (37.1 C) (Oral)  Resp 18  Ht 5\' 10"  (1.778 m)  Wt 315 lb (142.883 kg)  BMI 45.20 kg/m2  SpO2 100%  LMP 08/15/2010  Physical Exam  Nursing note and vitals reviewed. Constitutional: She is oriented to person, place, and time. She appears well-developed and well-nourished.  HENT:  Head: Normocephalic and atraumatic.  Eyes: EOM are normal. Pupils are equal, round, and reactive to light.  Neck: Normal range of motion. Neck supple.  Cardiovascular: Normal rate, normal heart sounds and intact distal pulses.   Pulmonary/Chest: Effort normal and breath sounds normal.  Abdominal: Bowel sounds are normal. She exhibits no distension. There is no tenderness.  Musculoskeletal: Normal range of motion. She exhibits no edema and no tenderness.  Neurological: She is alert and oriented to person, place, and time. No cranial nerve deficit.  Skin: Skin is warm and dry. No rash noted.  Psychiatric: She has a normal mood and affect.    ED Course  Procedures  MDM Pt feeling much better and ready go to home. Given toradol, solumedrol and  phenergan.       Charles B. Bernette Mayers, MD 10/30/10 [phone]

## 2010-10-30 NOTE — ED Notes (Signed)
Warm blankets given.

## 2010-11-08 ENCOUNTER — Emergency Department (HOSPITAL_BASED_OUTPATIENT_CLINIC_OR_DEPARTMENT_OTHER)
Admission: EM | Admit: 2010-11-08 | Discharge: 2010-11-09 | Disposition: A | Discharge status: Home / Self Care | Payer: BC Managed Care – PPO | Attending: Emergency Medicine | Admitting: Emergency Medicine

## 2010-11-08 ENCOUNTER — Encounter (HOSPITAL_BASED_OUTPATIENT_CLINIC_OR_DEPARTMENT_OTHER): Payer: Self-pay | Admitting: *Deleted

## 2010-11-08 DIAGNOSIS — R51 Headache: Secondary | ICD-10-CM

## 2010-11-08 DIAGNOSIS — F17200 Nicotine dependence, unspecified, uncomplicated: Secondary | ICD-10-CM | POA: Insufficient documentation

## 2010-11-08 MED ORDER — DIPHENHYDRAMINE HCL 50 MG/ML IJ SOLN
25.0000 mg | Freq: Once | INTRAMUSCULAR | Status: AC
  Administered 2010-11-09: 25 mg via INTRAVENOUS
  Filled 2010-11-08: qty 1

## 2010-11-08 MED ORDER — PROMETHAZINE HCL 25 MG/ML IJ SOLN
25.0000 mg | Freq: Once | INTRAMUSCULAR | Status: AC
  Administered 2010-11-09: 25 mg via INTRAVENOUS
  Filled 2010-11-08: qty 1

## 2010-11-08 MED ORDER — METOCLOPRAMIDE HCL 5 MG/ML IJ SOLN
10.0000 mg | Freq: Once | INTRAMUSCULAR | Status: AC
  Administered 2010-11-09: 10 mg via INTRAVENOUS
  Filled 2010-11-08: qty 2

## 2010-11-08 NOTE — ED Notes (Signed)
Pt c/o of sinus pain/pressure and states that she has tenderness to palpation of sinuses.  Upon exam R eardrum appears to be red and nasal mucosa red as well.  Pt denies nasal congestion/drainage.  C/o R ear pain and pressure over sinuses and radiating to back of head.

## 2010-11-08 NOTE — ED Notes (Signed)
Headache x 2 weeks. Has been here and her MD for tx. Temporary relief but pain comes back.

## 2010-11-09 MED ORDER — SODIUM CHLORIDE 0.9 % IV SOLN
INTRAVENOUS | Status: DC
  Administered 2010-11-09: via INTRAVENOUS

## 2010-11-09 NOTE — ED Provider Notes (Signed)
History     CSN: [id number] Arrival date & time: 11/08/2010 11:05 PM   Chief Complaint  Patient presents with  . Headache     (Include location/radiation/quality/duration/timing/severity/associated sxs/prior treatment) Patient is a 30 y.o. female presenting with headaches. The history is provided by the patient.  Headache  This is a new problem. The current episode started more than 1 week ago. The problem occurs constantly. The problem has not changed since onset.The headache is associated with nothing. The pain is located in the right unilateral region. The quality of the pain is described as throbbing. The pain is moderate. The pain radiates to the right neck. Associated symptoms include nausea. Pertinent negatives include no fever, no chest pressure, no palpitations, no syncope, no shortness of breath and no vomiting. She has tried oral narcotic analgesics for the symptoms. The treatment provided mild relief.   Has h/o of MHAs and this feels similar to those. No neck stiffness or congestion. No rash or sick contacts. No waekness or numbness. No trouble walking or speaking  Past Medical History  Diagnosis Date  . Migraine      Past Surgical History  Procedure Date  . Laparoscopic gastric banding   . Tonsillectomy   . Cholecystectomy     No family history on file.  History  Substance Use Topics  . Smoking status: Current Everyday Smoker  . Smokeless tobacco: Not on file  . Alcohol Use: No    OB History    Grav Para Term Preterm Abortions TAB SAB Ect Mult Living                  Review of Systems  Constitutional: Negative for fever and chills.  HENT: Negative for neck pain and neck stiffness.   Eyes: Negative for pain.  Respiratory: Negative for shortness of breath.   Cardiovascular: Negative for palpitations and syncope.  Gastrointestinal: Positive for nausea. Negative for vomiting and abdominal pain.  Genitourinary: Negative for dysuria.  Musculoskeletal:  Negative for back pain.  Skin: Negative for rash.  Neurological: Positive for headaches.  All other systems reviewed and are negative.    Allergies  Fish allergy; Escitalopram oxalate; Penicillins; and Pineapple  Home Medications   Current Outpatient Rx  Name Route Sig Dispense Refill  . BUPROPION HCL (XL) 150 MG PO TB24 Oral Take 150 mg by mouth daily.      Marland Kitchen BUTALBITAL-APAP-CAFF-COD 50-325-40-30 MG PO CAPS Oral Take 1 capsule by mouth every 4 (four) hours as needed. migraine    . ONE-DAILY MULTI VITAMINS PO TABS Oral Take 1 tablet by mouth daily.      Marland Kitchen RIZATRIPTAN BENZOATE 10 MG PO TABS Oral Take 10 mg by mouth as needed. For migraine. May repeat in 2 hours if needed    . TOPIRAMATE 100 MG PO TABS Oral Take 100 mg by mouth 2 (two) times daily.        Physical Exam    BP 127/95  Pulse 80  Temp(Src) 98.1 F (36.7 C) (Oral)  Resp 20  SpO2 99%  LMP 08/15/2010  Physical Exam  Constitutional: She is oriented to person, place, and time. She appears well-developed and well-nourished.  HENT:  Head: Normocephalic and atraumatic.  Eyes: Conjunctivae and EOM are normal. Pupils are equal, round, and reactive to light.  Neck: Full passive range of motion without pain. Neck supple. No thyromegaly present.       No meningismus  Cardiovascular: Normal rate, regular rhythm, S1 normal, S2 normal and intact  distal pulses.   Pulmonary/Chest: Effort normal and breath sounds normal.  Abdominal: Soft. Bowel sounds are normal. There is no tenderness. There is no CVA tenderness.  Musculoskeletal: Normal range of motion.  Neurological: She is alert and oriented to person, place, and time. She has normal strength and normal reflexes. No cranial nerve deficit or sensory deficit. She displays a negative Romberg sign. GCS eye subscore is 4. GCS verbal subscore is 5. GCS motor subscore is 6.       Normal Gait  Skin: Skin is warm and dry. No rash noted. No cyanosis. Nails show no clubbing.    Psychiatric: She has a normal mood and affect. Her speech is normal and behavior is normal.    ED Course  Procedures  Results for orders placed during the hospital encounter of 01/10/10  PREGNANCY, URINE      Component Value Range   Preg Test, Ur       Value: NEGATIVE            THE SENSITIVITY OF THIS     METHODOLOGY IS >24 mIU/mL  URINALYSIS, ROUTINE W REFLEX MICROSCOPIC      Component Value Range   Color, Urine YELLOW  YELLOW    Appearance CLEAR  CLEAR    Specific Gravity, Urine 1.013  1.005 - 1.030    pH 7.0  5.0 - 8.0    Glucose, UA NEGATIVE  NEGATIVE (mg/dL)   Hgb urine dipstick LARGE (*) NEGATIVE    Bilirubin Urine NEGATIVE  NEGATIVE    Ketones, ur NEGATIVE  NEGATIVE (mg/dL)   Protein, ur NEGATIVE  NEGATIVE (mg/dL)   Urobilinogen, UA 0.2  0.0 - 1.0 (mg/dL)   Nitrite NEGATIVE  NEGATIVE    Leukocytes, UA TRACE (*) NEGATIVE   URINE MICROSCOPIC-ADD ON      Component Value Range   Squamous Epithelial / LPF RARE  RARE    WBC, UA 0-2  <3 (WBC/hpf)   RBC / HPF 7-10  <3 (RBC/hpf)   Bacteria, UA RARE  RARE    No results found.   No diagnosis found.   MDM  HA no deficits, given HA cocktail and on recheck at 12:49 remains no neuro deficits and is stable for d/c home. Plan PCP follow up.        Sunnie Nielsen, MD 11/09/10 [phone]

## 2010-12-10 ENCOUNTER — Encounter (HOSPITAL_BASED_OUTPATIENT_CLINIC_OR_DEPARTMENT_OTHER): Payer: Self-pay | Admitting: *Deleted

## 2010-12-10 ENCOUNTER — Emergency Department (HOSPITAL_BASED_OUTPATIENT_CLINIC_OR_DEPARTMENT_OTHER)
Admission: EM | Admit: 2010-12-10 | Discharge: 2010-12-10 | Disposition: A | Discharge status: Home / Self Care | Payer: BC Managed Care – PPO | Attending: Emergency Medicine | Admitting: Emergency Medicine

## 2010-12-10 DIAGNOSIS — R51 Headache: Secondary | ICD-10-CM | POA: Insufficient documentation

## 2010-12-10 DIAGNOSIS — Z79899 Other long term (current) drug therapy: Secondary | ICD-10-CM | POA: Insufficient documentation

## 2010-12-10 DIAGNOSIS — F17200 Nicotine dependence, unspecified, uncomplicated: Secondary | ICD-10-CM | POA: Insufficient documentation

## 2010-12-10 MED ORDER — KETOROLAC TROMETHAMINE 30 MG/ML IJ SOLN
30.0000 mg | Freq: Once | INTRAMUSCULAR | Status: AC
  Administered 2010-12-10: 30 mg via INTRAVENOUS
  Filled 2010-12-10: qty 1

## 2010-12-10 MED ORDER — METOCLOPRAMIDE HCL 5 MG/ML IJ SOLN
10.0000 mg | Freq: Once | INTRAMUSCULAR | Status: AC
  Administered 2010-12-10: 10 mg via INTRAVENOUS
  Filled 2010-12-10: qty 2

## 2010-12-10 MED ORDER — DIPHENHYDRAMINE HCL 50 MG/ML IJ SOLN
25.0000 mg | Freq: Once | INTRAMUSCULAR | Status: AC
  Administered 2010-12-10: 25 mg via INTRAVENOUS
  Filled 2010-12-10: qty 1

## 2010-12-10 MED ORDER — DEXAMETHASONE SODIUM PHOSPHATE 10 MG/ML IJ SOLN
10.0000 mg | Freq: Once | INTRAMUSCULAR | Status: AC
  Administered 2010-12-10: 10 mg via INTRAVENOUS
  Filled 2010-12-10: qty 1

## 2010-12-10 MED ORDER — SODIUM CHLORIDE 0.9 % IV BOLUS (SEPSIS)
1000.0000 mL | Freq: Once | INTRAVENOUS | Status: AC
  Administered 2010-12-10 (×2): 1000 mL via INTRAVENOUS

## 2010-12-10 NOTE — ED Notes (Signed)
Pt instructed not to drive while taking hydrocodone.

## 2010-12-10 NOTE — ED Notes (Signed)
Pt reports that she has a history of headaches that are triggered by chocolate.  Pt reports pain behind her right eye with radiation to her right side of the neck.  Pt also reports blurred vision to right eye.  Pt denies any recent trauma or falls, states that this feels like her typical HA.

## 2010-12-10 NOTE — ED Provider Notes (Signed)
History     CSN: [id number] Arrival date & time: 12/10/2010 12:12 AM  Chief Complaint  Patient presents with  . Migraine    (Consider location/radiation/quality/duration/timing/severity/associated sxs/prior treatment) HPI Comments: Patient presents complaining of gradually worsening headache since yesterday.  She has had similar headaches in the past.  She notes that some past treaters have been lites, certain sounds, chocolate.  She has not been around any of these since the headache started yesterday.  The headache has gradually worsened today and is at its worst point currently.  Patient has had some associated nausea and vomiting.  She also describes some right eye blurry vision.  She has had all of these symptoms in the past with her headaches.  She is scheduled to see a neurologist next month for further evaluation since this is become a recurrent problem for her.  Patient is a 30 y.o. female presenting with migraine. The history is provided by the patient. No language interpreter was used.  Migraine This is a recurrent problem. The current episode started yesterday. The problem occurs constantly. The problem has been gradually worsening. Associated symptoms include headaches. Pertinent negatives include no chest pain, no abdominal pain and no shortness of breath.    Past Medical History  Diagnosis Date  . Migraine     Past Surgical History  Procedure Date  . Laparoscopic gastric banding   . Tonsillectomy   . Cholecystectomy     No family history on file.  History  Substance Use Topics  . Smoking status: Current Everyday Smoker  . Smokeless tobacco: Not on file  . Alcohol Use: No    OB History    Grav Para Term Preterm Abortions TAB SAB Ect Mult Living                  Review of Systems  Constitutional: Negative.  Negative for fever and chills.  Eyes: Positive for photophobia. Negative for discharge and redness.  Respiratory: Negative.  Negative for cough and  shortness of breath.   Cardiovascular: Negative.  Negative for chest pain.  Gastrointestinal: Negative.  Negative for nausea, vomiting, abdominal pain and diarrhea.  Genitourinary: Negative.  Negative for dysuria and vaginal discharge.  Musculoskeletal: Negative.  Negative for back pain.  Skin: Negative.  Negative for color change and rash.  Neurological: Positive for headaches. Negative for syncope.  Hematological: Negative.  Negative for adenopathy.  Psychiatric/Behavioral: Negative.  Negative for confusion.  All other systems reviewed and are negative.    Allergies  Fish allergy; Escitalopram oxalate; Penicillins; and Pineapple  Home Medications   Current Outpatient Rx  Name Route Sig Dispense Refill  . BUPROPION HCL ER (XL) 150 MG PO TB24 Oral Take 150 mg by mouth daily.      Marland Kitchen BUTALBITAL-APAP-CAFF-COD 50-325-40-30 MG PO CAPS Oral Take 1 capsule by mouth every 4 (four) hours as needed. migraine    . HYDROCODONE-IBUPROFEN 5-200 MG PO TABS Oral Take 1 tablet by mouth every 6 (six) hours as needed.      Marland Kitchen ONE-DAILY MULTI VITAMINS PO TABS Oral Take 1 tablet by mouth daily.      Marland Kitchen RIZATRIPTAN BENZOATE 10 MG PO TABS Oral Take 10 mg by mouth as needed. For migraine. May repeat in 2 hours if needed    . TOPIRAMATE 100 MG PO TABS Oral Take 100 mg by mouth 2 (two) times daily.        BP 166/96  Pulse 86  Temp(Src) 98.9 F (37.2 C) (Oral)  Resp  18  SpO2 100%  LMP 12/06/2010  Physical Exam  Constitutional: She is oriented to person, place, and time. She appears well-developed and well-nourished.  Non-toxic appearance. She does not have a sickly appearance.  HENT:  Head: Normocephalic and atraumatic.  Eyes: Conjunctivae, EOM and lids are normal. Pupils are equal, round, and reactive to light. No scleral icterus.  Neck: Trachea normal and normal range of motion. Neck supple.  Cardiovascular: Normal rate, regular rhythm and normal heart sounds.   Pulmonary/Chest: Effort normal and  breath sounds normal. No respiratory distress. She has no wheezes. She has no rales.  Abdominal: Soft. Normal appearance. There is no tenderness. There is no rebound, no guarding and no CVA tenderness.  Musculoskeletal: Normal range of motion.  Neurological: She is alert and oriented to person, place, and time. She has normal strength.  Skin: Skin is warm, dry and intact. No rash noted.  Psychiatric: She has a normal mood and affect. Her behavior is normal. Judgment and thought content normal.    ED Course  Procedures (including critical care time)  Labs Reviewed - No data to display No results found.   1. Headache       MDM  Patient's headache is much improved after the medications and she wishes to go home at this point in time.  She has no neurologic deficits.  She has no signs for infection as she has no fevers or neck stiffness to suggest meningitis.  As the headache is gradually gotten worse and this is similar to her past headaches this did not appear to be subarachnoid hemorrhage.        Nat Christen, MD 12/10/10 [phone]

## 2010-12-17 ENCOUNTER — Other Ambulatory Visit (HOSPITAL_COMMUNITY)
Admission: RE | Admit: 2010-12-17 | Discharge: 2010-12-17 | Disposition: A | Discharge status: Home / Self Care | Payer: BC Managed Care – PPO | Source: Ambulatory Visit | Attending: Obstetrics and Gynecology | Admitting: Obstetrics and Gynecology

## 2010-12-17 ENCOUNTER — Other Ambulatory Visit: Payer: Self-pay | Admitting: Obstetrics and Gynecology

## 2010-12-17 DIAGNOSIS — Z124 Encounter for screening for malignant neoplasm of cervix: Secondary | ICD-10-CM | POA: Insufficient documentation

## 2010-12-17 DIAGNOSIS — Z1159 Encounter for screening for other viral diseases: Secondary | ICD-10-CM | POA: Insufficient documentation

## 2010-12-17 DIAGNOSIS — Z113 Encounter for screening for infections with a predominantly sexual mode of transmission: Secondary | ICD-10-CM | POA: Insufficient documentation

## 2011-10-18 DIAGNOSIS — F17200 Nicotine dependence, unspecified, uncomplicated: Secondary | ICD-10-CM | POA: Insufficient documentation

## 2011-10-18 DIAGNOSIS — G43909 Migraine, unspecified, not intractable, without status migrainosus: Secondary | ICD-10-CM | POA: Insufficient documentation

## 2011-10-18 DIAGNOSIS — Z8614 Personal history of Methicillin resistant Staphylococcus aureus infection: Secondary | ICD-10-CM | POA: Insufficient documentation

## 2011-10-18 DIAGNOSIS — Z880 Allergy status to penicillin: Secondary | ICD-10-CM | POA: Insufficient documentation

## 2011-10-18 DIAGNOSIS — Z91013 Allergy to seafood: Secondary | ICD-10-CM | POA: Insufficient documentation

## 2011-10-19 ENCOUNTER — Encounter (HOSPITAL_BASED_OUTPATIENT_CLINIC_OR_DEPARTMENT_OTHER): Payer: Self-pay | Admitting: *Deleted

## 2011-10-19 ENCOUNTER — Emergency Department (HOSPITAL_BASED_OUTPATIENT_CLINIC_OR_DEPARTMENT_OTHER)
Admission: EM | Admit: 2011-10-19 | Discharge: 2011-10-19 | Disposition: A | Discharge status: Home / Self Care | Payer: BC Managed Care – PPO | Attending: Emergency Medicine | Admitting: Emergency Medicine

## 2011-10-19 DIAGNOSIS — G43909 Migraine, unspecified, not intractable, without status migrainosus: Secondary | ICD-10-CM

## 2011-10-19 HISTORY — DX: Methicillin resistant Staphylococcus aureus infection, unspecified site: A49.02

## 2011-10-19 MED ORDER — SODIUM CHLORIDE 0.9 % IV BOLUS (SEPSIS)
1000.0000 mL | Freq: Once | INTRAVENOUS | Status: AC
  Administered 2011-10-19: 1000 mL via INTRAVENOUS

## 2011-10-19 MED ORDER — KETOROLAC TROMETHAMINE 30 MG/ML IJ SOLN
30.0000 mg | Freq: Once | INTRAMUSCULAR | Status: AC
  Administered 2011-10-19: 30 mg via INTRAVENOUS
  Filled 2011-10-19: qty 1

## 2011-10-19 MED ORDER — DIPHENHYDRAMINE HCL 50 MG/ML IJ SOLN
25.0000 mg | Freq: Once | INTRAMUSCULAR | Status: AC
  Administered 2011-10-19: 25 mg via INTRAVENOUS
  Filled 2011-10-19: qty 1

## 2011-10-19 MED ORDER — ONDANSETRON HCL 4 MG/2ML IJ SOLN
4.0000 mg | Freq: Once | INTRAMUSCULAR | Status: AC
  Administered 2011-10-19: 4 mg via INTRAVENOUS
  Filled 2011-10-19: qty 2

## 2011-10-19 MED ORDER — PROMETHAZINE HCL 25 MG/ML IJ SOLN
25.0000 mg | Freq: Once | INTRAMUSCULAR | Status: AC
  Administered 2011-10-19: 25 mg via INTRAVENOUS
  Filled 2011-10-19: qty 1

## 2011-10-19 NOTE — ED Provider Notes (Signed)
History     CSN: [id number]  Arrival date & time 10/18/11  2347   First MD Initiated Contact with Patient 10/19/11 0240      Chief Complaint  Patient presents with  . Migraine    (Consider location/radiation/quality/duration/timing/severity/associated sxs/prior treatment) HPI This is a 31 year old black female with a history of migraine. She's had a migraine headache for about the last 11 hours. She states the symptoms are severe. It is located in the right side of the head and is consistent with previous migraines. There is associated nausea, a single episode of emesis and photophobia. She denies focal neurologic deficit. Her home medications have not relieved her symptoms.  Past Medical History  Diagnosis Date  . Migraine   . MRSA (methicillin resistant Staphylococcus aureus)     Past Surgical History  Procedure Date  . Laparoscopic gastric banding   . Tonsillectomy   . Cholecystectomy     History reviewed. No pertinent family history.  History  Substance Use Topics  . Smoking status: Current Everyday Smoker  . Smokeless tobacco: Not on file  . Alcohol Use: No    OB History    Grav Para Term Preterm Abortions TAB SAB Ect Mult Living                  Review of Systems  All other systems reviewed and are negative.    Allergies  Fish allergy; Escitalopram oxalate; Penicillins; and Pineapple  Home Medications   Current Outpatient Rx  Name Route Sig Dispense Refill  . DOXYCYCLINE HYCLATE 100 MG PO CPEP Oral Take 100 mg by mouth 2 (two) times daily.    Marland Kitchen LEVOTHYROXINE SODIUM 100 MCG PO CAPS Oral Take 1 capsule by mouth daily.    . BUPROPION HCL ER (XL) 150 MG PO TB24 Oral Take 150 mg by mouth daily.      Marland Kitchen BUTALBITAL-APAP-CAFF-COD 50-325-40-30 MG PO CAPS Oral Take 1 capsule by mouth every 4 (four) hours as needed. migraine    . HYDROCODONE-IBUPROFEN 5-200 MG PO TABS Oral Take 1 tablet by mouth every 6 (six) hours as needed.      Marland Kitchen ONE-DAILY MULTI VITAMINS PO  TABS Oral Take 1 tablet by mouth daily.      Marland Kitchen RIZATRIPTAN BENZOATE 10 MG PO TABS Oral Take 10 mg by mouth as needed. For migraine. May repeat in 2 hours if needed    . TOPIRAMATE 100 MG PO TABS Oral Take 100 mg by mouth 2 (two) times daily.        BP 160/90  Pulse 60  Temp 98 F (36.7 C) (Oral)  Resp 20  Ht 5\' 10"  (1.778 m)  Wt 358 lb (162.388 kg)  BMI 51.37 kg/m2  SpO2 99%  LMP 10/16/2011  Physical Exam General: Well-developed, well-nourished female in no acute distress; appearance consistent with age of record HENT: normocephalic, atraumatic Eyes: pupils equal round and reactive to light; extraocular muscles intact Neck: supple Heart: regular rate and rhythm Lungs: clear to auscultation bilaterally Abdomen: soft; obese Extremities: No deformity; full range of motion; pulses normal Neurologic: Awake, alert and oriented; motor function intact in all extremities and symmetric; no facial droop Skin: Warm and dry Psychiatric: Normal mood and affect    ED Course  Procedures (including critical care time)    MDM  4:48 AM Pain now 1/10 after IV medications. Patient states nausea has returned but is less severe than earlier.        Hanley Seamen, MD 10/19/11  0448 

## 2011-10-19 NOTE — ED Notes (Signed)
Pt states she has a hx of migraines. Tried home tx but no better. States she sometimes has to come here and get Benadryl, Toradol, Phenergan and/or Demerol. +nausea PERL

## 2011-11-26 ENCOUNTER — Emergency Department (HOSPITAL_BASED_OUTPATIENT_CLINIC_OR_DEPARTMENT_OTHER)
Admission: EM | Admit: 2011-11-26 | Discharge: 2011-11-26 | Disposition: A | Discharge status: Home / Self Care | Payer: BC Managed Care – PPO | Attending: Emergency Medicine | Admitting: Emergency Medicine

## 2011-11-26 ENCOUNTER — Encounter (HOSPITAL_BASED_OUTPATIENT_CLINIC_OR_DEPARTMENT_OTHER): Payer: Self-pay | Admitting: *Deleted

## 2011-11-26 DIAGNOSIS — F17200 Nicotine dependence, unspecified, uncomplicated: Secondary | ICD-10-CM | POA: Insufficient documentation

## 2011-11-26 DIAGNOSIS — A4902 Methicillin resistant Staphylococcus aureus infection, unspecified site: Secondary | ICD-10-CM | POA: Insufficient documentation

## 2011-11-26 DIAGNOSIS — Z91013 Allergy to seafood: Secondary | ICD-10-CM | POA: Insufficient documentation

## 2011-11-26 DIAGNOSIS — G43909 Migraine, unspecified, not intractable, without status migrainosus: Secondary | ICD-10-CM | POA: Insufficient documentation

## 2011-11-26 DIAGNOSIS — Z888 Allergy status to other drugs, medicaments and biological substances status: Secondary | ICD-10-CM | POA: Insufficient documentation

## 2011-11-26 MED ORDER — KETOROLAC TROMETHAMINE 30 MG/ML IJ SOLN
30.0000 mg | Freq: Once | INTRAMUSCULAR | Status: AC
  Administered 2011-11-26: 30 mg via INTRAVENOUS
  Filled 2011-11-26: qty 1

## 2011-11-26 MED ORDER — PROMETHAZINE HCL 25 MG/ML IJ SOLN
25.0000 mg | Freq: Once | INTRAMUSCULAR | Status: AC
  Administered 2011-11-26: 25 mg via INTRAVENOUS
  Filled 2011-11-26: qty 1

## 2011-11-26 MED ORDER — DIPHENHYDRAMINE HCL 50 MG/ML IJ SOLN
INTRAMUSCULAR | Status: AC
  Filled 2011-11-26: qty 1

## 2011-11-26 MED ORDER — SODIUM CHLORIDE 0.9 % IV BOLUS (SEPSIS)
1000.0000 mL | Freq: Once | INTRAVENOUS | Status: AC
  Administered 2011-11-26: 1000 mL via INTRAVENOUS

## 2011-11-26 MED ORDER — DIPHENHYDRAMINE HCL 50 MG/ML IJ SOLN
25.0000 mg | Freq: Once | INTRAMUSCULAR | Status: AC
  Administered 2011-11-26: 25 mg via INTRAVENOUS

## 2011-11-26 NOTE — Discharge Instructions (Signed)
Migraine Headache A migraine headache is an intense, throbbing pain on one or both sides of your head. A migraine can last for 30 minutes to several hours. CAUSES  The exact cause of a migraine headache is not always known. However, a migraine may be caused when nerves in the brain become irritated and release chemicals that cause inflammation. This causes pain. SYMPTOMS  Pain on one or both sides of your head.  Pulsating or throbbing pain.  Severe pain that prevents daily activities.  Pain that is aggravated by any physical activity.  Nausea, vomiting, or both.  Dizziness.  Pain with exposure to bright lights, loud noises, or activity.  General sensitivity to bright lights, loud noises, or smells. Before you get a migraine, you may get warning signs that a migraine is coming (aura). An aura may include:  Seeing flashing lights.  Seeing bright spots, halos, or zig-zag lines.  Having tunnel vision or blurred vision.  Having feelings of numbness or tingling.  Having trouble talking.  Having muscle weakness. MIGRAINE TRIGGERS  Alcohol.  Smoking.  Stress.  Menstruation.  Aged cheeses.  Foods or drinks that contain nitrates, glutamate, aspartame, or tyramine.  Lack of sleep.  Chocolate.  Caffeine.  Hunger.  Physical exertion.  Fatigue.  Medicines used to treat chest pain (nitroglycerine), birth control pills, estrogen, and some blood pressure medicines. DIAGNOSIS  A migraine headache is often diagnosed based on:  Symptoms.  Physical examination.  A CT scan or MRI of your head. TREATMENT Medicines may be given for pain and nausea. Medicines can also be given to help prevent recurrent migraines.  HOME CARE INSTRUCTIONS  Only take over-the-counter or prescription medicines for pain or discomfort as directed by your caregiver. The use of long-term narcotics is not recommended.  Lie down in a dark, quiet room when you have a migraine.  Keep a journal  to find out what may trigger your migraine headaches. For example, write down:  What you eat and drink.  How much sleep you get.  Any change to your diet or medicines.  Limit alcohol consumption.  Quit smoking if you smoke.  Get 7 to 9 hours of sleep, or as recommended by your caregiver.  Limit stress.  Keep lights dim if bright lights bother you and make your migraines worse. SEEK IMMEDIATE MEDICAL CARE IF:   Your migraine becomes severe.  You have a fever.  You have a stiff neck.  You have vision loss.  You have muscular weakness or loss of muscle control.  You start losing your balance or have trouble walking.  You feel faint or pass out.  You have severe symptoms that are different from your first symptoms. MAKE SURE YOU:   Understand these instructions.  Will watch your condition.  Will get help right away if you are not doing well or get worse. Document Released: 02/10/2005 Document Revised: 05/05/2011 Document Reviewed: 01/31/2011 [employer] Patient Information 2013 [employer].  

## 2011-11-26 NOTE — ED Provider Notes (Signed)
History     CSN: [id number]  Arrival date & time 11/26/11  0120   First MD Initiated Contact with Patient 11/26/11 0133      Chief Complaint  Patient presents with  . Migraine    (Consider location/radiation/quality/duration/timing/severity/associated sxs/prior treatment) HPI This is a 31 year old black female with a history of migraines. She is here with a migraine that started 5 hours ago. It is on the right side of her head and behind her right eye. It is characterized as like previous migraines. She states the pain is severe. It is associated with nausea but no vomiting. She has photophobia but no focal neurologic deficits. She tried Maxalt at home without relief.  Past Medical History  Diagnosis Date  . Migraine   . MRSA (methicillin resistant Staphylococcus aureus)     Past Surgical History  Procedure Date  . Laparoscopic gastric banding   . Tonsillectomy   . Cholecystectomy     History reviewed. No pertinent family history.  History  Substance Use Topics  . Smoking status: Current Every Day Smoker  . Smokeless tobacco: Not on file  . Alcohol Use: No    OB History    Grav Para Term Preterm Abortions TAB SAB Ect Mult Living                  Review of Systems  All other systems reviewed and are negative.    Allergies  Fish allergy; Escitalopram oxalate; Penicillins; and Pineapple  Home Medications   Current Outpatient Rx  Name Route Sig Dispense Refill  . BUPROPION HCL ER (XL) 150 MG PO TB24 Oral Take 150 mg by mouth daily.      Marland Kitchen BUTALBITAL-APAP-CAFF-COD 50-325-40-30 MG PO CAPS Oral Take 1 capsule by mouth every 4 (four) hours as needed. migraine    . DOXYCYCLINE HYCLATE 100 MG PO CPEP Oral Take 100 mg by mouth 2 (two) times daily.    Marland Kitchen HYDROCODONE-IBUPROFEN 5-200 MG PO TABS Oral Take 1 tablet by mouth every 6 (six) hours as needed.      Marland Kitchen LEVOTHYROXINE SODIUM 100 MCG PO CAPS Oral Take 1 capsule by mouth daily.    Marland Kitchen ONE-DAILY MULTI VITAMINS PO TABS  Oral Take 1 tablet by mouth daily.      Marland Kitchen RIZATRIPTAN BENZOATE 10 MG PO TABS Oral Take 10 mg by mouth as needed. For migraine. May repeat in 2 hours if needed    . TOPIRAMATE 100 MG PO TABS Oral Take 100 mg by mouth 2 (two) times daily.        BP 155/94  Pulse 73  Temp 98.3 F (36.8 C) (Oral)  Resp 18  Ht 5\' 10"  (1.778 m)  Wt 339 lb (153.769 kg)  BMI 48.64 kg/m2  SpO2 100%  LMP 11/21/2011  Physical Exam General: Well-developed, obese female in no acute distress; appearance consistent with age of record HENT: normocephalic, atraumatic Eyes: pupils equal round and reactive to light; extraocular muscles intact Neck: supple Heart: regular rate and rhythm Lungs: clear to auscultation bilaterally Abdomen: soft; obese Extremities: No deformity; full range of motion Neurologic: Awake, alert and oriented; motor function intact in all extremities and symmetric; no facial droop; normal gait Skin: Warm and dry     ED Course  Procedures (including critical care time)     MDM  3:52 AM Symptoms relieved after IV fluid bolus and IV medications.        Hanley Seamen, MD 11/26/11 [phone]

## 2011-11-26 NOTE — ED Notes (Signed)
Migraine x 5 hours with nausea but no vomiting, no relief from maxalt

## 2011-11-26 NOTE — ED Notes (Signed)
IV fluids complete. Pt sts she feels like going home.

## 2012-06-16 ENCOUNTER — Other Ambulatory Visit: Payer: Self-pay | Admitting: Obstetrics and Gynecology

## 2012-06-16 ENCOUNTER — Other Ambulatory Visit (HOSPITAL_COMMUNITY)
Admission: RE | Admit: 2012-06-16 | Discharge: 2012-06-16 | Disposition: A | Discharge status: Home / Self Care | Payer: 59 | Source: Ambulatory Visit | Attending: Obstetrics and Gynecology | Admitting: Obstetrics and Gynecology

## 2012-06-16 DIAGNOSIS — Z30431 Encounter for routine checking of intrauterine contraceptive device: Secondary | ICD-10-CM

## 2012-06-16 DIAGNOSIS — T8389XA Other specified complication of genitourinary prosthetic devices, implants and grafts, initial encounter: Secondary | ICD-10-CM

## 2012-06-16 DIAGNOSIS — Z01419 Encounter for gynecological examination (general) (routine) without abnormal findings: Secondary | ICD-10-CM | POA: Insufficient documentation

## 2012-06-16 DIAGNOSIS — N644 Mastodynia: Secondary | ICD-10-CM

## 2012-06-18 ENCOUNTER — Ambulatory Visit
Admission: RE | Admit: 2012-06-18 | Discharge: 2012-06-18 | Disposition: A | Discharge status: Home / Self Care | Payer: 59 | Source: Ambulatory Visit | Attending: Obstetrics and Gynecology | Admitting: Obstetrics and Gynecology

## 2012-06-18 DIAGNOSIS — Z30431 Encounter for routine checking of intrauterine contraceptive device: Secondary | ICD-10-CM

## 2012-06-28 ENCOUNTER — Ambulatory Visit
Admission: RE | Admit: 2012-06-28 | Discharge: 2012-06-28 | Disposition: A | Discharge status: Home / Self Care | Payer: 59 | Source: Ambulatory Visit | Attending: Obstetrics and Gynecology | Admitting: Obstetrics and Gynecology

## 2012-06-28 DIAGNOSIS — N644 Mastodynia: Secondary | ICD-10-CM

## 2013-07-25 DIAGNOSIS — E039 Hypothyroidism, unspecified: Secondary | ICD-10-CM | POA: Diagnosis not present

## 2013-07-25 DIAGNOSIS — R7301 Impaired fasting glucose: Secondary | ICD-10-CM | POA: Diagnosis not present

## 2013-07-25 DIAGNOSIS — I10 Essential (primary) hypertension: Secondary | ICD-10-CM | POA: Diagnosis not present

## 2013-07-25 DIAGNOSIS — G43909 Migraine, unspecified, not intractable, without status migrainosus: Secondary | ICD-10-CM | POA: Diagnosis not present

## 2013-07-25 DIAGNOSIS — F17200 Nicotine dependence, unspecified, uncomplicated: Secondary | ICD-10-CM | POA: Diagnosis not present

## 2013-07-25 DIAGNOSIS — Z0000 Encounter for general adult medical examination without abnormal findings: Secondary | ICD-10-CM | POA: Diagnosis not present

## 2013-07-25 DIAGNOSIS — Z0110 Encounter for examination of ears and hearing without abnormal findings: Secondary | ICD-10-CM | POA: Diagnosis not present

## 2013-07-25 DIAGNOSIS — G4733 Obstructive sleep apnea (adult) (pediatric): Secondary | ICD-10-CM | POA: Diagnosis not present

## 2013-08-08 DIAGNOSIS — L732 Hidradenitis suppurativa: Secondary | ICD-10-CM | POA: Diagnosis not present

## 2013-08-08 DIAGNOSIS — G4733 Obstructive sleep apnea (adult) (pediatric): Secondary | ICD-10-CM | POA: Diagnosis not present

## 2013-08-08 DIAGNOSIS — Z79899 Other long term (current) drug therapy: Secondary | ICD-10-CM | POA: Diagnosis not present

## 2013-08-08 DIAGNOSIS — G43909 Migraine, unspecified, not intractable, without status migrainosus: Secondary | ICD-10-CM | POA: Diagnosis not present

## 2013-08-08 DIAGNOSIS — E559 Vitamin D deficiency, unspecified: Secondary | ICD-10-CM | POA: Diagnosis not present

## 2013-08-08 DIAGNOSIS — E039 Hypothyroidism, unspecified: Secondary | ICD-10-CM | POA: Diagnosis not present

## 2013-08-08 DIAGNOSIS — E7210 Disorders of sulfur-bearing amino-acid metabolism, unspecified: Secondary | ICD-10-CM | POA: Diagnosis not present

## 2013-08-08 DIAGNOSIS — I10 Essential (primary) hypertension: Secondary | ICD-10-CM | POA: Diagnosis not present

## 2013-08-08 DIAGNOSIS — F17200 Nicotine dependence, unspecified, uncomplicated: Secondary | ICD-10-CM | POA: Diagnosis not present

## 2013-08-08 DIAGNOSIS — R7301 Impaired fasting glucose: Secondary | ICD-10-CM | POA: Diagnosis not present

## 2013-08-15 DIAGNOSIS — L732 Hidradenitis suppurativa: Secondary | ICD-10-CM | POA: Diagnosis not present

## 2013-09-12 DIAGNOSIS — G43909 Migraine, unspecified, not intractable, without status migrainosus: Secondary | ICD-10-CM | POA: Diagnosis not present

## 2013-09-12 DIAGNOSIS — R0989 Other specified symptoms and signs involving the circulatory and respiratory systems: Secondary | ICD-10-CM | POA: Diagnosis not present

## 2013-09-12 DIAGNOSIS — G4733 Obstructive sleep apnea (adult) (pediatric): Secondary | ICD-10-CM | POA: Diagnosis not present

## 2013-09-12 DIAGNOSIS — R0609 Other forms of dyspnea: Secondary | ICD-10-CM | POA: Diagnosis not present

## 2013-09-12 DIAGNOSIS — J309 Allergic rhinitis, unspecified: Secondary | ICD-10-CM | POA: Diagnosis not present

## 2013-10-09 DIAGNOSIS — Z87892 Personal history of anaphylaxis: Secondary | ICD-10-CM | POA: Diagnosis not present

## 2013-10-09 DIAGNOSIS — Z91013 Allergy to seafood: Secondary | ICD-10-CM | POA: Diagnosis not present

## 2013-10-09 DIAGNOSIS — T7840XA Allergy, unspecified, initial encounter: Secondary | ICD-10-CM | POA: Diagnosis not present

## 2013-10-09 DIAGNOSIS — L299 Pruritus, unspecified: Secondary | ICD-10-CM | POA: Diagnosis not present

## 2013-10-09 DIAGNOSIS — Z79899 Other long term (current) drug therapy: Secondary | ICD-10-CM | POA: Diagnosis not present

## 2013-10-09 DIAGNOSIS — F17200 Nicotine dependence, unspecified, uncomplicated: Secondary | ICD-10-CM | POA: Diagnosis not present

## 2013-10-09 DIAGNOSIS — Z880 Allergy status to penicillin: Secondary | ICD-10-CM | POA: Diagnosis not present

## 2013-10-10 DIAGNOSIS — G43839 Menstrual migraine, intractable, without status migrainosus: Secondary | ICD-10-CM | POA: Diagnosis not present

## 2013-10-10 DIAGNOSIS — G43719 Chronic migraine without aura, intractable, without status migrainosus: Secondary | ICD-10-CM | POA: Diagnosis not present

## 2013-10-27 DIAGNOSIS — I10 Essential (primary) hypertension: Secondary | ICD-10-CM | POA: Diagnosis not present

## 2013-12-13 ENCOUNTER — Ambulatory Visit: Payer: 59 | Admitting: *Deleted

## 2014-01-10 ENCOUNTER — Ambulatory Visit: Payer: 59 | Admitting: Dietician

## 2014-01-22 DIAGNOSIS — L732 Hidradenitis suppurativa: Secondary | ICD-10-CM | POA: Diagnosis not present

## 2014-01-22 DIAGNOSIS — G43009 Migraine without aura, not intractable, without status migrainosus: Secondary | ICD-10-CM | POA: Diagnosis not present

## 2014-01-22 DIAGNOSIS — Z720 Tobacco use: Secondary | ICD-10-CM | POA: Diagnosis not present

## 2014-01-22 DIAGNOSIS — Z9884 Bariatric surgery status: Secondary | ICD-10-CM | POA: Diagnosis not present

## 2014-01-22 DIAGNOSIS — G4730 Sleep apnea, unspecified: Secondary | ICD-10-CM | POA: Diagnosis not present

## 2014-01-22 DIAGNOSIS — E039 Hypothyroidism, unspecified: Secondary | ICD-10-CM | POA: Diagnosis not present

## 2014-01-22 DIAGNOSIS — I10 Essential (primary) hypertension: Secondary | ICD-10-CM | POA: Diagnosis not present

## 2014-03-12 DIAGNOSIS — I10 Essential (primary) hypertension: Secondary | ICD-10-CM | POA: Diagnosis not present

## 2014-03-12 DIAGNOSIS — F17200 Nicotine dependence, unspecified, uncomplicated: Secondary | ICD-10-CM | POA: Diagnosis not present

## 2014-03-12 DIAGNOSIS — E039 Hypothyroidism, unspecified: Secondary | ICD-10-CM | POA: Diagnosis not present

## 2014-03-12 DIAGNOSIS — R509 Fever, unspecified: Secondary | ICD-10-CM | POA: Diagnosis not present

## 2014-03-12 DIAGNOSIS — L732 Hidradenitis suppurativa: Secondary | ICD-10-CM | POA: Diagnosis not present

## 2014-03-12 DIAGNOSIS — L02412 Cutaneous abscess of left axilla: Secondary | ICD-10-CM | POA: Diagnosis not present

## 2014-03-12 DIAGNOSIS — R000 Tachycardia, unspecified: Secondary | ICD-10-CM | POA: Diagnosis not present

## 2014-03-12 DIAGNOSIS — Z9884 Bariatric surgery status: Secondary | ICD-10-CM | POA: Diagnosis not present

## 2014-03-15 DIAGNOSIS — M67442 Ganglion, left hand: Secondary | ICD-10-CM | POA: Diagnosis not present

## 2014-03-15 DIAGNOSIS — M722 Plantar fascial fibromatosis: Secondary | ICD-10-CM | POA: Diagnosis not present

## 2014-03-22 DIAGNOSIS — I10 Essential (primary) hypertension: Secondary | ICD-10-CM | POA: Diagnosis not present

## 2014-03-22 DIAGNOSIS — F17210 Nicotine dependence, cigarettes, uncomplicated: Secondary | ICD-10-CM | POA: Diagnosis not present

## 2014-03-22 DIAGNOSIS — G43009 Migraine without aura, not intractable, without status migrainosus: Secondary | ICD-10-CM | POA: Diagnosis not present

## 2014-03-22 DIAGNOSIS — Z872 Personal history of diseases of the skin and subcutaneous tissue: Secondary | ICD-10-CM | POA: Diagnosis not present

## 2014-03-22 DIAGNOSIS — F17200 Nicotine dependence, unspecified, uncomplicated: Secondary | ICD-10-CM | POA: Diagnosis not present

## 2014-03-22 DIAGNOSIS — F5101 Primary insomnia: Secondary | ICD-10-CM | POA: Diagnosis not present

## 2014-03-22 DIAGNOSIS — G4733 Obstructive sleep apnea (adult) (pediatric): Secondary | ICD-10-CM | POA: Diagnosis not present

## 2014-03-22 DIAGNOSIS — Z6841 Body Mass Index (BMI) 40.0 and over, adult: Secondary | ICD-10-CM | POA: Diagnosis not present

## 2014-04-16 DIAGNOSIS — Z4889 Encounter for other specified surgical aftercare: Secondary | ICD-10-CM | POA: Diagnosis not present

## 2014-04-16 DIAGNOSIS — F17210 Nicotine dependence, cigarettes, uncomplicated: Secondary | ICD-10-CM | POA: Diagnosis not present

## 2014-04-27 DIAGNOSIS — M722 Plantar fascial fibromatosis: Secondary | ICD-10-CM | POA: Diagnosis not present

## 2014-06-21 DIAGNOSIS — G4733 Obstructive sleep apnea (adult) (pediatric): Secondary | ICD-10-CM | POA: Diagnosis not present

## 2014-06-21 DIAGNOSIS — E559 Vitamin D deficiency, unspecified: Secondary | ICD-10-CM | POA: Diagnosis not present

## 2014-06-21 DIAGNOSIS — I10 Essential (primary) hypertension: Secondary | ICD-10-CM | POA: Diagnosis not present

## 2014-06-26 DIAGNOSIS — J029 Acute pharyngitis, unspecified: Secondary | ICD-10-CM | POA: Diagnosis not present

## 2014-07-06 DIAGNOSIS — Z30432 Encounter for removal of intrauterine contraceptive device: Secondary | ICD-10-CM | POA: Diagnosis not present

## 2014-07-06 DIAGNOSIS — Z30431 Encounter for routine checking of intrauterine contraceptive device: Secondary | ICD-10-CM | POA: Diagnosis not present

## 2014-07-06 DIAGNOSIS — Z309 Encounter for contraceptive management, unspecified: Secondary | ICD-10-CM | POA: Diagnosis not present

## 2014-08-11 DIAGNOSIS — Z01411 Encounter for gynecological examination (general) (routine) with abnormal findings: Secondary | ICD-10-CM | POA: Diagnosis not present

## 2014-08-11 DIAGNOSIS — N910 Primary amenorrhea: Secondary | ICD-10-CM | POA: Diagnosis not present

## 2014-08-11 DIAGNOSIS — Z6841 Body Mass Index (BMI) 40.0 and over, adult: Secondary | ICD-10-CM | POA: Diagnosis not present

## 2014-09-26 DIAGNOSIS — M722 Plantar fascial fibromatosis: Secondary | ICD-10-CM | POA: Diagnosis not present

## 2014-10-18 DIAGNOSIS — G47419 Narcolepsy without cataplexy: Secondary | ICD-10-CM | POA: Diagnosis not present

## 2014-11-15 DIAGNOSIS — G47419 Narcolepsy without cataplexy: Secondary | ICD-10-CM | POA: Diagnosis not present

## 2014-12-20 DIAGNOSIS — I10 Essential (primary) hypertension: Secondary | ICD-10-CM | POA: Diagnosis not present

## 2014-12-20 DIAGNOSIS — M7731 Calcaneal spur, right foot: Secondary | ICD-10-CM | POA: Diagnosis not present

## 2015-02-28 DIAGNOSIS — M79671 Pain in right foot: Secondary | ICD-10-CM | POA: Diagnosis not present

## 2015-02-28 DIAGNOSIS — R2689 Other abnormalities of gait and mobility: Secondary | ICD-10-CM | POA: Diagnosis not present

## 2015-02-28 DIAGNOSIS — M6281 Muscle weakness (generalized): Secondary | ICD-10-CM | POA: Diagnosis not present

## 2015-02-28 DIAGNOSIS — M722 Plantar fascial fibromatosis: Secondary | ICD-10-CM | POA: Diagnosis not present

## 2015-03-07 DIAGNOSIS — M79671 Pain in right foot: Secondary | ICD-10-CM | POA: Diagnosis not present

## 2015-03-07 DIAGNOSIS — M6281 Muscle weakness (generalized): Secondary | ICD-10-CM | POA: Diagnosis not present

## 2015-03-07 DIAGNOSIS — R2689 Other abnormalities of gait and mobility: Secondary | ICD-10-CM | POA: Diagnosis not present

## 2015-03-07 DIAGNOSIS — M722 Plantar fascial fibromatosis: Secondary | ICD-10-CM | POA: Diagnosis not present

## 2015-03-14 DIAGNOSIS — M6281 Muscle weakness (generalized): Secondary | ICD-10-CM | POA: Diagnosis not present

## 2015-03-14 DIAGNOSIS — R2689 Other abnormalities of gait and mobility: Secondary | ICD-10-CM | POA: Diagnosis not present

## 2015-03-14 DIAGNOSIS — M79671 Pain in right foot: Secondary | ICD-10-CM | POA: Diagnosis not present

## 2015-03-14 DIAGNOSIS — M722 Plantar fascial fibromatosis: Secondary | ICD-10-CM | POA: Diagnosis not present

## 2015-03-19 DIAGNOSIS — M79671 Pain in right foot: Secondary | ICD-10-CM | POA: Diagnosis not present

## 2015-03-19 DIAGNOSIS — M722 Plantar fascial fibromatosis: Secondary | ICD-10-CM | POA: Diagnosis not present

## 2015-03-19 DIAGNOSIS — R2689 Other abnormalities of gait and mobility: Secondary | ICD-10-CM | POA: Diagnosis not present

## 2015-03-19 DIAGNOSIS — M6281 Muscle weakness (generalized): Secondary | ICD-10-CM | POA: Diagnosis not present

## 2015-03-26 DIAGNOSIS — M6281 Muscle weakness (generalized): Secondary | ICD-10-CM | POA: Diagnosis not present

## 2015-03-26 DIAGNOSIS — M722 Plantar fascial fibromatosis: Secondary | ICD-10-CM | POA: Diagnosis not present

## 2015-03-26 DIAGNOSIS — R2689 Other abnormalities of gait and mobility: Secondary | ICD-10-CM | POA: Diagnosis not present

## 2015-03-26 DIAGNOSIS — M79671 Pain in right foot: Secondary | ICD-10-CM | POA: Diagnosis not present

## 2015-03-28 DIAGNOSIS — M6281 Muscle weakness (generalized): Secondary | ICD-10-CM | POA: Diagnosis not present

## 2015-03-28 DIAGNOSIS — M79671 Pain in right foot: Secondary | ICD-10-CM | POA: Diagnosis not present

## 2015-03-28 DIAGNOSIS — R2689 Other abnormalities of gait and mobility: Secondary | ICD-10-CM | POA: Diagnosis not present

## 2015-05-07 DIAGNOSIS — G47419 Narcolepsy without cataplexy: Secondary | ICD-10-CM | POA: Diagnosis not present

## 2015-05-24 DIAGNOSIS — B351 Tinea unguium: Secondary | ICD-10-CM | POA: Diagnosis not present

## 2015-05-24 DIAGNOSIS — L03031 Cellulitis of right toe: Secondary | ICD-10-CM | POA: Diagnosis not present

## 2015-05-28 DIAGNOSIS — M25561 Pain in right knee: Secondary | ICD-10-CM | POA: Diagnosis not present

## 2015-05-28 DIAGNOSIS — E039 Hypothyroidism, unspecified: Secondary | ICD-10-CM | POA: Diagnosis not present

## 2015-05-28 DIAGNOSIS — F17210 Nicotine dependence, cigarettes, uncomplicated: Secondary | ICD-10-CM | POA: Diagnosis not present

## 2015-05-28 DIAGNOSIS — Z888 Allergy status to other drugs, medicaments and biological substances status: Secondary | ICD-10-CM | POA: Diagnosis not present

## 2015-05-28 DIAGNOSIS — Z880 Allergy status to penicillin: Secondary | ICD-10-CM | POA: Diagnosis not present

## 2015-05-28 DIAGNOSIS — I10 Essential (primary) hypertension: Secondary | ICD-10-CM | POA: Diagnosis not present

## 2015-05-28 DIAGNOSIS — Z79899 Other long term (current) drug therapy: Secondary | ICD-10-CM | POA: Diagnosis not present

## 2015-06-16 DIAGNOSIS — M436 Torticollis: Secondary | ICD-10-CM | POA: Diagnosis not present

## 2015-06-21 DIAGNOSIS — I10 Essential (primary) hypertension: Secondary | ICD-10-CM | POA: Diagnosis not present

## 2015-07-05 DIAGNOSIS — I10 Essential (primary) hypertension: Secondary | ICD-10-CM | POA: Diagnosis not present

## 2015-08-10 ENCOUNTER — Other Ambulatory Visit: Payer: Self-pay | Admitting: Obstetrics and Gynecology

## 2015-08-10 ENCOUNTER — Other Ambulatory Visit (HOSPITAL_COMMUNITY)
Admission: RE | Admit: 2015-08-10 | Discharge: 2015-08-10 | Disposition: A | Discharge status: Home / Self Care | Payer: 59 | Source: Ambulatory Visit | Attending: Obstetrics and Gynecology | Admitting: Obstetrics and Gynecology

## 2015-08-10 DIAGNOSIS — Z1151 Encounter for screening for human papillomavirus (HPV): Secondary | ICD-10-CM | POA: Insufficient documentation

## 2015-08-10 DIAGNOSIS — Z01419 Encounter for gynecological examination (general) (routine) without abnormal findings: Secondary | ICD-10-CM | POA: Insufficient documentation

## 2015-08-14 LAB — CYTOLOGY - PAP

## 2015-09-21 ENCOUNTER — Other Ambulatory Visit: Payer: Self-pay | Admitting: Obstetrics and Gynecology

## 2015-10-15 ENCOUNTER — Other Ambulatory Visit: Payer: Self-pay | Admitting: Obstetrics and Gynecology

## 2015-10-15 DIAGNOSIS — T8384XA Pain from genitourinary prosthetic devices, implants and grafts, initial encounter: Secondary | ICD-10-CM

## 2015-10-16 ENCOUNTER — Ambulatory Visit
Admission: RE | Admit: 2015-10-16 | Discharge: 2015-10-16 | Disposition: A | Discharge status: Home / Self Care | Payer: Medicare Other | Source: Ambulatory Visit | Attending: Obstetrics and Gynecology | Admitting: Obstetrics and Gynecology

## 2015-10-16 ENCOUNTER — Ambulatory Visit
Admission: RE | Admit: 2015-10-16 | Discharge: 2015-10-16 | Disposition: A | Discharge status: Home / Self Care | Payer: 59 | Source: Ambulatory Visit | Attending: Obstetrics and Gynecology | Admitting: Obstetrics and Gynecology

## 2015-10-16 DIAGNOSIS — T8384XA Pain from genitourinary prosthetic devices, implants and grafts, initial encounter: Secondary | ICD-10-CM

## 2016-04-02 DIAGNOSIS — M25562 Pain in left knee: Secondary | ICD-10-CM | POA: Diagnosis not present

## 2016-04-02 DIAGNOSIS — M2242 Chondromalacia patellae, left knee: Secondary | ICD-10-CM | POA: Diagnosis not present

## 2016-04-02 DIAGNOSIS — S838X2A Sprain of other specified parts of left knee, initial encounter: Secondary | ICD-10-CM | POA: Diagnosis not present

## 2016-04-16 DIAGNOSIS — Z6841 Body Mass Index (BMI) 40.0 and over, adult: Secondary | ICD-10-CM | POA: Diagnosis not present

## 2016-04-16 DIAGNOSIS — Z0000 Encounter for general adult medical examination without abnormal findings: Secondary | ICD-10-CM | POA: Diagnosis not present

## 2016-04-16 DIAGNOSIS — Z1239 Encounter for other screening for malignant neoplasm of breast: Secondary | ICD-10-CM | POA: Diagnosis not present

## 2016-04-16 DIAGNOSIS — N939 Abnormal uterine and vaginal bleeding, unspecified: Secondary | ICD-10-CM | POA: Diagnosis not present

## 2016-04-16 DIAGNOSIS — I10 Essential (primary) hypertension: Secondary | ICD-10-CM | POA: Diagnosis not present

## 2016-04-16 DIAGNOSIS — E559 Vitamin D deficiency, unspecified: Secondary | ICD-10-CM | POA: Diagnosis not present

## 2016-04-16 DIAGNOSIS — G43019 Migraine without aura, intractable, without status migrainosus: Secondary | ICD-10-CM | POA: Diagnosis not present

## 2016-04-16 DIAGNOSIS — G47419 Narcolepsy without cataplexy: Secondary | ICD-10-CM | POA: Diagnosis not present

## 2016-04-16 DIAGNOSIS — E785 Hyperlipidemia, unspecified: Secondary | ICD-10-CM | POA: Diagnosis not present

## 2016-04-16 DIAGNOSIS — L732 Hidradenitis suppurativa: Secondary | ICD-10-CM | POA: Diagnosis not present

## 2016-04-30 DIAGNOSIS — Z6841 Body Mass Index (BMI) 40.0 and over, adult: Secondary | ICD-10-CM | POA: Diagnosis not present

## 2016-04-30 DIAGNOSIS — Z9884 Bariatric surgery status: Secondary | ICD-10-CM | POA: Diagnosis not present

## 2016-04-30 DIAGNOSIS — R51 Headache: Secondary | ICD-10-CM | POA: Diagnosis not present

## 2016-04-30 DIAGNOSIS — Z79899 Other long term (current) drug therapy: Secondary | ICD-10-CM | POA: Diagnosis not present

## 2016-04-30 DIAGNOSIS — I10 Essential (primary) hypertension: Secondary | ICD-10-CM | POA: Diagnosis not present

## 2016-04-30 DIAGNOSIS — Z87891 Personal history of nicotine dependence: Secondary | ICD-10-CM | POA: Diagnosis not present

## 2016-05-01 DIAGNOSIS — Z87891 Personal history of nicotine dependence: Secondary | ICD-10-CM | POA: Diagnosis not present

## 2016-05-01 DIAGNOSIS — S83242D Other tear of medial meniscus, current injury, left knee, subsequent encounter: Secondary | ICD-10-CM | POA: Diagnosis not present

## 2016-05-20 DIAGNOSIS — S83242D Other tear of medial meniscus, current injury, left knee, subsequent encounter: Secondary | ICD-10-CM | POA: Diagnosis not present

## 2016-05-28 DIAGNOSIS — S83242D Other tear of medial meniscus, current injury, left knee, subsequent encounter: Secondary | ICD-10-CM | POA: Diagnosis not present

## 2016-05-28 DIAGNOSIS — M2242 Chondromalacia patellae, left knee: Secondary | ICD-10-CM | POA: Diagnosis not present

## 2016-06-18 DIAGNOSIS — M228X2 Other disorders of patella, left knee: Secondary | ICD-10-CM | POA: Diagnosis not present

## 2016-06-25 DIAGNOSIS — M25662 Stiffness of left knee, not elsewhere classified: Secondary | ICD-10-CM | POA: Diagnosis not present

## 2016-06-25 DIAGNOSIS — Z7409 Other reduced mobility: Secondary | ICD-10-CM | POA: Diagnosis not present

## 2016-06-25 DIAGNOSIS — G8929 Other chronic pain: Secondary | ICD-10-CM | POA: Diagnosis not present

## 2016-06-25 DIAGNOSIS — M228X2 Other disorders of patella, left knee: Secondary | ICD-10-CM | POA: Diagnosis not present

## 2016-06-25 DIAGNOSIS — M6281 Muscle weakness (generalized): Secondary | ICD-10-CM | POA: Diagnosis not present

## 2016-06-25 DIAGNOSIS — M25562 Pain in left knee: Secondary | ICD-10-CM | POA: Diagnosis not present

## 2016-07-01 DIAGNOSIS — E559 Vitamin D deficiency, unspecified: Secondary | ICD-10-CM | POA: Diagnosis not present

## 2016-07-02 DIAGNOSIS — M6281 Muscle weakness (generalized): Secondary | ICD-10-CM | POA: Diagnosis not present

## 2016-07-02 DIAGNOSIS — Z7409 Other reduced mobility: Secondary | ICD-10-CM | POA: Diagnosis not present

## 2016-07-02 DIAGNOSIS — M25662 Stiffness of left knee, not elsewhere classified: Secondary | ICD-10-CM | POA: Diagnosis not present

## 2016-07-02 DIAGNOSIS — M25562 Pain in left knee: Secondary | ICD-10-CM | POA: Diagnosis not present

## 2016-07-02 DIAGNOSIS — M228X2 Other disorders of patella, left knee: Secondary | ICD-10-CM | POA: Diagnosis not present

## 2016-07-02 DIAGNOSIS — G8929 Other chronic pain: Secondary | ICD-10-CM | POA: Diagnosis not present

## 2016-07-07 DIAGNOSIS — I10 Essential (primary) hypertension: Secondary | ICD-10-CM | POA: Diagnosis not present

## 2016-07-07 DIAGNOSIS — Z6841 Body Mass Index (BMI) 40.0 and over, adult: Secondary | ICD-10-CM | POA: Diagnosis not present

## 2016-07-07 DIAGNOSIS — D72829 Elevated white blood cell count, unspecified: Secondary | ICD-10-CM | POA: Diagnosis not present

## 2016-07-07 DIAGNOSIS — E559 Vitamin D deficiency, unspecified: Secondary | ICD-10-CM | POA: Diagnosis not present

## 2016-07-07 DIAGNOSIS — G47419 Narcolepsy without cataplexy: Secondary | ICD-10-CM | POA: Diagnosis not present

## 2016-07-08 DIAGNOSIS — M25662 Stiffness of left knee, not elsewhere classified: Secondary | ICD-10-CM | POA: Diagnosis not present

## 2016-07-08 DIAGNOSIS — M25562 Pain in left knee: Secondary | ICD-10-CM | POA: Diagnosis not present

## 2016-07-08 DIAGNOSIS — M6281 Muscle weakness (generalized): Secondary | ICD-10-CM | POA: Diagnosis not present

## 2016-07-08 DIAGNOSIS — M228X2 Other disorders of patella, left knee: Secondary | ICD-10-CM | POA: Diagnosis not present

## 2016-07-08 DIAGNOSIS — G8929 Other chronic pain: Secondary | ICD-10-CM | POA: Diagnosis not present

## 2016-07-08 DIAGNOSIS — Z7409 Other reduced mobility: Secondary | ICD-10-CM | POA: Diagnosis not present

## 2016-07-10 DIAGNOSIS — Z7409 Other reduced mobility: Secondary | ICD-10-CM | POA: Diagnosis not present

## 2016-07-10 DIAGNOSIS — M25662 Stiffness of left knee, not elsewhere classified: Secondary | ICD-10-CM | POA: Diagnosis not present

## 2016-07-10 DIAGNOSIS — M228X2 Other disorders of patella, left knee: Secondary | ICD-10-CM | POA: Diagnosis not present

## 2016-07-10 DIAGNOSIS — G8929 Other chronic pain: Secondary | ICD-10-CM | POA: Diagnosis not present

## 2016-07-10 DIAGNOSIS — M6281 Muscle weakness (generalized): Secondary | ICD-10-CM | POA: Diagnosis not present

## 2016-07-10 DIAGNOSIS — M25562 Pain in left knee: Secondary | ICD-10-CM | POA: Diagnosis not present

## 2016-07-15 DIAGNOSIS — G8929 Other chronic pain: Secondary | ICD-10-CM | POA: Diagnosis not present

## 2016-07-15 DIAGNOSIS — M6281 Muscle weakness (generalized): Secondary | ICD-10-CM | POA: Diagnosis not present

## 2016-07-15 DIAGNOSIS — M25562 Pain in left knee: Secondary | ICD-10-CM | POA: Diagnosis not present

## 2016-07-15 DIAGNOSIS — M25662 Stiffness of left knee, not elsewhere classified: Secondary | ICD-10-CM | POA: Diagnosis not present

## 2016-07-15 DIAGNOSIS — M228X2 Other disorders of patella, left knee: Secondary | ICD-10-CM | POA: Diagnosis not present

## 2016-07-15 DIAGNOSIS — Z7409 Other reduced mobility: Secondary | ICD-10-CM | POA: Diagnosis not present

## 2016-07-16 DIAGNOSIS — R110 Nausea: Secondary | ICD-10-CM | POA: Diagnosis not present

## 2016-07-16 DIAGNOSIS — Z9884 Bariatric surgery status: Secondary | ICD-10-CM | POA: Diagnosis not present

## 2016-07-17 DIAGNOSIS — M6281 Muscle weakness (generalized): Secondary | ICD-10-CM | POA: Diagnosis not present

## 2016-07-17 DIAGNOSIS — G8929 Other chronic pain: Secondary | ICD-10-CM | POA: Diagnosis not present

## 2016-07-17 DIAGNOSIS — Z7409 Other reduced mobility: Secondary | ICD-10-CM | POA: Diagnosis not present

## 2016-07-17 DIAGNOSIS — M228X2 Other disorders of patella, left knee: Secondary | ICD-10-CM | POA: Diagnosis not present

## 2016-07-17 DIAGNOSIS — M25562 Pain in left knee: Secondary | ICD-10-CM | POA: Diagnosis not present

## 2016-07-17 DIAGNOSIS — M25662 Stiffness of left knee, not elsewhere classified: Secondary | ICD-10-CM | POA: Diagnosis not present

## 2016-07-23 DIAGNOSIS — M25562 Pain in left knee: Secondary | ICD-10-CM | POA: Diagnosis not present

## 2016-07-23 DIAGNOSIS — G8929 Other chronic pain: Secondary | ICD-10-CM | POA: Diagnosis not present

## 2016-07-23 DIAGNOSIS — M25662 Stiffness of left knee, not elsewhere classified: Secondary | ICD-10-CM | POA: Diagnosis not present

## 2016-07-23 DIAGNOSIS — M6281 Muscle weakness (generalized): Secondary | ICD-10-CM | POA: Diagnosis not present

## 2016-07-23 DIAGNOSIS — M228X2 Other disorders of patella, left knee: Secondary | ICD-10-CM | POA: Diagnosis not present

## 2016-07-23 DIAGNOSIS — Z7409 Other reduced mobility: Secondary | ICD-10-CM | POA: Diagnosis not present

## 2017-01-22 ENCOUNTER — Other Ambulatory Visit: Payer: Self-pay | Admitting: Obstetrics and Gynecology

## 2017-01-22 DIAGNOSIS — Z3A20 20 weeks gestation of pregnancy: Secondary | ICD-10-CM

## 2017-01-22 DIAGNOSIS — O99212 Obesity complicating pregnancy, second trimester: Secondary | ICD-10-CM

## 2017-01-22 DIAGNOSIS — Z3689 Encounter for other specified antenatal screening: Secondary | ICD-10-CM

## 2017-01-22 DIAGNOSIS — O09522 Supervision of elderly multigravida, second trimester: Secondary | ICD-10-CM

## 2017-01-28 ENCOUNTER — Encounter (HOSPITAL_COMMUNITY): Payer: Self-pay | Admitting: Internal Medicine

## 2017-02-04 ENCOUNTER — Encounter (HOSPITAL_COMMUNITY): Payer: Self-pay | Admitting: *Deleted

## 2017-02-05 ENCOUNTER — Ambulatory Visit (HOSPITAL_COMMUNITY)
Admission: RE | Admit: 2017-02-05 | Discharge: 2017-02-05 | Disposition: A | Discharge status: Home / Self Care | Payer: Medicare Other | Source: Ambulatory Visit | Attending: Obstetrics and Gynecology | Admitting: Obstetrics and Gynecology

## 2017-02-05 HISTORY — DX: Morbid (severe) obesity due to excess calories: E66.01

## 2017-02-05 HISTORY — DX: Unspecified abnormal cytological findings in specimens from vagina: R87.629

## 2017-02-05 HISTORY — DX: Essential (primary) hypertension: I10

## 2017-02-05 HISTORY — DX: Urinary tract infection, site not specified: N39.0

## 2017-02-05 HISTORY — DX: Hypothyroidism, unspecified: E03.9

## 2017-02-16 ENCOUNTER — Ambulatory Visit (HOSPITAL_COMMUNITY)
Admission: RE | Admit: 2017-02-16 | Discharge: 2017-02-16 | Disposition: A | Discharge status: Home / Self Care | Payer: Medicare Other | Source: Ambulatory Visit | Attending: Obstetrics and Gynecology | Admitting: Obstetrics and Gynecology

## 2017-02-16 ENCOUNTER — Other Ambulatory Visit (HOSPITAL_COMMUNITY): Payer: Self-pay | Admitting: *Deleted

## 2017-02-16 ENCOUNTER — Encounter (HOSPITAL_COMMUNITY): Payer: Self-pay

## 2017-02-16 DIAGNOSIS — Z3A21 21 weeks gestation of pregnancy: Secondary | ICD-10-CM | POA: Diagnosis not present

## 2017-02-16 DIAGNOSIS — O99212 Obesity complicating pregnancy, second trimester: Secondary | ICD-10-CM | POA: Diagnosis present

## 2017-02-16 DIAGNOSIS — O09522 Supervision of elderly multigravida, second trimester: Secondary | ICD-10-CM

## 2017-02-16 DIAGNOSIS — Z0489 Encounter for examination and observation for other specified reasons: Secondary | ICD-10-CM

## 2017-02-16 DIAGNOSIS — Z3689 Encounter for other specified antenatal screening: Secondary | ICD-10-CM

## 2017-02-16 DIAGNOSIS — Z3A20 20 weeks gestation of pregnancy: Secondary | ICD-10-CM

## 2017-02-16 DIAGNOSIS — IMO0002 Reserved for concepts with insufficient information to code with codable children: Secondary | ICD-10-CM

## 2017-03-18 ENCOUNTER — Encounter (HOSPITAL_COMMUNITY): Payer: Self-pay

## 2017-03-18 ENCOUNTER — Ambulatory Visit (HOSPITAL_COMMUNITY)
Admission: RE | Admit: 2017-03-18 | Discharge: 2017-03-18 | Disposition: A | Discharge status: Home / Self Care | Payer: Managed Care, Other (non HMO) | Source: Ambulatory Visit | Attending: Obstetrics and Gynecology | Admitting: Obstetrics and Gynecology

## 2017-03-18 ENCOUNTER — Other Ambulatory Visit (HOSPITAL_COMMUNITY): Payer: Self-pay | Admitting: *Deleted

## 2017-03-18 DIAGNOSIS — O99213 Obesity complicating pregnancy, third trimester: Secondary | ICD-10-CM | POA: Insufficient documentation

## 2017-03-18 DIAGNOSIS — Z6841 Body Mass Index (BMI) 40.0 and over, adult: Secondary | ICD-10-CM | POA: Diagnosis not present

## 2017-03-18 DIAGNOSIS — IMO0002 Reserved for concepts with insufficient information to code with codable children: Secondary | ICD-10-CM

## 2017-03-18 DIAGNOSIS — Z3A32 32 weeks gestation of pregnancy: Secondary | ICD-10-CM | POA: Insufficient documentation

## 2017-03-18 DIAGNOSIS — Z0489 Encounter for examination and observation for other specified reasons: Secondary | ICD-10-CM

## 2017-03-18 DIAGNOSIS — I10 Essential (primary) hypertension: Secondary | ICD-10-CM

## 2017-03-18 NOTE — Addendum Note (Signed)
Encounter addended by: Levonne HubertStalter, Omayra Tulloch M, RDMS, RVT on: 03/18/2017 1:35 PM  Actions taken: Imaging Exam ended

## 2017-04-15 ENCOUNTER — Encounter (HOSPITAL_COMMUNITY): Payer: Self-pay

## 2017-04-15 ENCOUNTER — Ambulatory Visit (HOSPITAL_COMMUNITY)
Admission: RE | Admit: 2017-04-15 | Discharge: 2017-04-15 | Disposition: A | Discharge status: Home / Self Care | Payer: Managed Care, Other (non HMO) | Source: Ambulatory Visit | Attending: Obstetrics and Gynecology | Admitting: Obstetrics and Gynecology

## 2017-04-15 DIAGNOSIS — O99283 Endocrine, nutritional and metabolic diseases complicating pregnancy, third trimester: Secondary | ICD-10-CM | POA: Diagnosis not present

## 2017-04-15 DIAGNOSIS — E039 Hypothyroidism, unspecified: Secondary | ICD-10-CM | POA: Insufficient documentation

## 2017-04-15 DIAGNOSIS — O99213 Obesity complicating pregnancy, third trimester: Secondary | ICD-10-CM | POA: Diagnosis not present

## 2017-04-15 DIAGNOSIS — Z3A29 29 weeks gestation of pregnancy: Secondary | ICD-10-CM | POA: Insufficient documentation

## 2017-04-15 DIAGNOSIS — O09893 Supervision of other high risk pregnancies, third trimester: Secondary | ICD-10-CM | POA: Insufficient documentation

## 2017-04-15 DIAGNOSIS — Z362 Encounter for other antenatal screening follow-up: Secondary | ICD-10-CM | POA: Diagnosis present

## 2017-04-15 DIAGNOSIS — O10013 Pre-existing essential hypertension complicating pregnancy, third trimester: Secondary | ICD-10-CM | POA: Insufficient documentation

## 2017-04-15 DIAGNOSIS — O09523 Supervision of elderly multigravida, third trimester: Secondary | ICD-10-CM | POA: Diagnosis not present

## 2017-04-15 DIAGNOSIS — I10 Essential (primary) hypertension: Secondary | ICD-10-CM

## 2017-04-15 DIAGNOSIS — E669 Obesity, unspecified: Secondary | ICD-10-CM | POA: Diagnosis not present

## 2017-04-29 ENCOUNTER — Ambulatory Visit (HOSPITAL_COMMUNITY)
Admission: RE | Admit: 2017-04-29 | Discharge: 2017-04-29 | Disposition: A | Discharge status: Home / Self Care | Payer: Managed Care, Other (non HMO) | Source: Ambulatory Visit | Attending: Obstetrics and Gynecology | Admitting: Obstetrics and Gynecology

## 2017-04-29 ENCOUNTER — Encounter (HOSPITAL_COMMUNITY): Payer: Self-pay

## 2017-04-29 DIAGNOSIS — Z3A31 31 weeks gestation of pregnancy: Secondary | ICD-10-CM | POA: Diagnosis not present

## 2017-04-29 DIAGNOSIS — O99213 Obesity complicating pregnancy, third trimester: Secondary | ICD-10-CM | POA: Insufficient documentation

## 2017-04-29 DIAGNOSIS — O99283 Endocrine, nutritional and metabolic diseases complicating pregnancy, third trimester: Secondary | ICD-10-CM | POA: Diagnosis not present

## 2017-04-29 DIAGNOSIS — O10013 Pre-existing essential hypertension complicating pregnancy, third trimester: Secondary | ICD-10-CM | POA: Diagnosis present

## 2017-04-29 DIAGNOSIS — E669 Obesity, unspecified: Secondary | ICD-10-CM | POA: Insufficient documentation

## 2017-04-29 DIAGNOSIS — O09523 Supervision of elderly multigravida, third trimester: Secondary | ICD-10-CM | POA: Insufficient documentation

## 2017-04-29 DIAGNOSIS — E039 Hypothyroidism, unspecified: Secondary | ICD-10-CM | POA: Diagnosis not present

## 2017-04-29 DIAGNOSIS — O09893 Supervision of other high risk pregnancies, third trimester: Secondary | ICD-10-CM | POA: Diagnosis not present

## 2017-04-29 DIAGNOSIS — I10 Essential (primary) hypertension: Secondary | ICD-10-CM

## 2017-04-29 NOTE — Addendum Note (Signed)
Encounter addended by: Lenoard AdenJohnson, Chantea Surace M, RDMS on: 04/29/2017 10:25 AM  Actions taken: Imaging Exam ended

## 2017-05-06 ENCOUNTER — Encounter (HOSPITAL_COMMUNITY): Payer: Self-pay

## 2017-05-06 ENCOUNTER — Other Ambulatory Visit (HOSPITAL_COMMUNITY): Payer: Self-pay | Admitting: Obstetrics and Gynecology

## 2017-05-06 ENCOUNTER — Ambulatory Visit (HOSPITAL_COMMUNITY)
Admission: RE | Admit: 2017-05-06 | Discharge: 2017-05-06 | Disposition: A | Discharge status: Home / Self Care | Payer: Managed Care, Other (non HMO) | Source: Ambulatory Visit | Attending: Obstetrics and Gynecology | Admitting: Obstetrics and Gynecology

## 2017-05-06 DIAGNOSIS — E039 Hypothyroidism, unspecified: Secondary | ICD-10-CM | POA: Insufficient documentation

## 2017-05-06 DIAGNOSIS — Z3A32 32 weeks gestation of pregnancy: Secondary | ICD-10-CM

## 2017-05-06 DIAGNOSIS — O99283 Endocrine, nutritional and metabolic diseases complicating pregnancy, third trimester: Secondary | ICD-10-CM | POA: Insufficient documentation

## 2017-05-06 DIAGNOSIS — O09523 Supervision of elderly multigravida, third trimester: Secondary | ICD-10-CM

## 2017-05-06 DIAGNOSIS — O10013 Pre-existing essential hypertension complicating pregnancy, third trimester: Secondary | ICD-10-CM | POA: Insufficient documentation

## 2017-05-06 DIAGNOSIS — O99213 Obesity complicating pregnancy, third trimester: Secondary | ICD-10-CM | POA: Diagnosis present

## 2017-05-06 DIAGNOSIS — E669 Obesity, unspecified: Secondary | ICD-10-CM | POA: Insufficient documentation

## 2017-05-06 DIAGNOSIS — I10 Essential (primary) hypertension: Secondary | ICD-10-CM

## 2017-05-06 NOTE — ED Notes (Signed)
Did not take BP med this morning, will take med after her ultrasound appointment.

## 2017-05-13 ENCOUNTER — Ambulatory Visit (HOSPITAL_COMMUNITY)
Admission: RE | Admit: 2017-05-13 | Discharge: 2017-05-13 | Disposition: A | Discharge status: Home / Self Care | Payer: Managed Care, Other (non HMO) | Source: Ambulatory Visit | Attending: Obstetrics and Gynecology | Admitting: Obstetrics and Gynecology

## 2017-05-13 ENCOUNTER — Encounter (HOSPITAL_COMMUNITY): Payer: Self-pay

## 2017-05-13 ENCOUNTER — Other Ambulatory Visit (HOSPITAL_COMMUNITY): Payer: Self-pay | Admitting: Obstetrics and Gynecology

## 2017-05-13 DIAGNOSIS — O10013 Pre-existing essential hypertension complicating pregnancy, third trimester: Secondary | ICD-10-CM

## 2017-05-13 DIAGNOSIS — I10 Essential (primary) hypertension: Secondary | ICD-10-CM

## 2017-05-13 DIAGNOSIS — Z3A33 33 weeks gestation of pregnancy: Secondary | ICD-10-CM

## 2017-05-13 DIAGNOSIS — E039 Hypothyroidism, unspecified: Secondary | ICD-10-CM | POA: Diagnosis not present

## 2017-05-13 DIAGNOSIS — O99283 Endocrine, nutritional and metabolic diseases complicating pregnancy, third trimester: Secondary | ICD-10-CM | POA: Insufficient documentation

## 2017-05-13 DIAGNOSIS — O09523 Supervision of elderly multigravida, third trimester: Secondary | ICD-10-CM | POA: Insufficient documentation

## 2017-05-13 DIAGNOSIS — O99213 Obesity complicating pregnancy, third trimester: Secondary | ICD-10-CM | POA: Insufficient documentation

## 2017-05-20 ENCOUNTER — Other Ambulatory Visit (HOSPITAL_COMMUNITY): Payer: Self-pay | Admitting: Obstetrics and Gynecology

## 2017-05-20 ENCOUNTER — Ambulatory Visit (HOSPITAL_COMMUNITY)
Admission: RE | Admit: 2017-05-20 | Discharge: 2017-05-20 | Disposition: A | Discharge status: Home / Self Care | Payer: Managed Care, Other (non HMO) | Source: Ambulatory Visit | Attending: Obstetrics and Gynecology | Admitting: Obstetrics and Gynecology

## 2017-05-20 ENCOUNTER — Encounter (HOSPITAL_COMMUNITY): Payer: Self-pay

## 2017-05-20 DIAGNOSIS — I10 Essential (primary) hypertension: Secondary | ICD-10-CM

## 2017-05-20 DIAGNOSIS — O99213 Obesity complicating pregnancy, third trimester: Secondary | ICD-10-CM

## 2017-05-20 DIAGNOSIS — O09523 Supervision of elderly multigravida, third trimester: Secondary | ICD-10-CM

## 2017-05-20 DIAGNOSIS — O99283 Endocrine, nutritional and metabolic diseases complicating pregnancy, third trimester: Secondary | ICD-10-CM

## 2017-05-20 DIAGNOSIS — O34219 Maternal care for unspecified type scar from previous cesarean delivery: Secondary | ICD-10-CM

## 2017-05-20 DIAGNOSIS — E039 Hypothyroidism, unspecified: Secondary | ICD-10-CM

## 2017-05-20 DIAGNOSIS — Z3A34 34 weeks gestation of pregnancy: Secondary | ICD-10-CM | POA: Diagnosis not present

## 2017-05-21 ENCOUNTER — Encounter (HOSPITAL_COMMUNITY): Payer: Self-pay | Admitting: *Deleted

## 2017-05-21 ENCOUNTER — Inpatient Hospital Stay (HOSPITAL_COMMUNITY)
Admission: AD | Admit: 2017-05-21 | Discharge: 2017-05-21 | Disposition: A | Discharge status: Home / Self Care | Payer: Managed Care, Other (non HMO) | Source: Ambulatory Visit | Attending: Obstetrics and Gynecology | Admitting: Obstetrics and Gynecology

## 2017-05-21 ENCOUNTER — Other Ambulatory Visit: Payer: Self-pay

## 2017-05-21 DIAGNOSIS — O163 Unspecified maternal hypertension, third trimester: Secondary | ICD-10-CM | POA: Diagnosis present

## 2017-05-21 DIAGNOSIS — Z79890 Hormone replacement therapy (postmenopausal): Secondary | ICD-10-CM | POA: Insufficient documentation

## 2017-05-21 DIAGNOSIS — Z3A35 35 weeks gestation of pregnancy: Secondary | ICD-10-CM | POA: Diagnosis not present

## 2017-05-21 DIAGNOSIS — O99283 Endocrine, nutritional and metabolic diseases complicating pregnancy, third trimester: Secondary | ICD-10-CM | POA: Diagnosis not present

## 2017-05-21 DIAGNOSIS — O10913 Unspecified pre-existing hypertension complicating pregnancy, third trimester: Secondary | ICD-10-CM

## 2017-05-21 DIAGNOSIS — E039 Hypothyroidism, unspecified: Secondary | ICD-10-CM | POA: Diagnosis not present

## 2017-05-21 DIAGNOSIS — G43019 Migraine without aura, intractable, without status migrainosus: Secondary | ICD-10-CM

## 2017-05-21 LAB — PROTEIN / CREATININE RATIO, URINE
CREATININE, URINE: 124 mg/dL
PROTEIN CREATININE RATIO: 0.26 mg/mg{creat} — AB (ref 0.00–0.15)
TOTAL PROTEIN, URINE: 32 mg/dL

## 2017-05-21 LAB — COMPREHENSIVE METABOLIC PANEL
ALT: 16 U/L (ref 14–54)
ANION GAP: 11 (ref 5–15)
AST: 23 U/L (ref 15–41)
Albumin: 3.2 g/dL — ABNORMAL LOW (ref 3.5–5.0)
Alkaline Phosphatase: 67 U/L (ref 38–126)
BUN: <5 mg/dL — ABNORMAL LOW (ref 6–20)
CHLORIDE: 102 mmol/L (ref 101–111)
CO2: 20 mmol/L — AB (ref 22–32)
Calcium: 8.8 mg/dL — ABNORMAL LOW (ref 8.9–10.3)
Creatinine, Ser: 0.59 mg/dL (ref 0.44–1.00)
GFR calc non Af Amer: >60 mL/min (ref >60)
Glucose, Bld: 87 mg/dL (ref 65–99)
Potassium: 3.3 mmol/L — ABNORMAL LOW (ref 3.5–5.1)
SODIUM: 133 mmol/L — AB (ref 135–145)
Total Bilirubin: 0.5 mg/dL (ref 0.3–1.2)
Total Protein: 7.5 g/dL (ref 6.5–8.1)

## 2017-05-21 LAB — CBC
HCT: 32.8 % — ABNORMAL LOW (ref 36.0–46.0)
Hemoglobin: 11.9 g/dL — ABNORMAL LOW (ref 12.0–15.0)
MCH: 27.2 pg (ref 26.0–34.0)
MCHC: 36.3 g/dL — AB (ref 30.0–36.0)
MCV: 75.1 fL — ABNORMAL LOW (ref 78.0–100.0)
PLATELETS: 158 10*3/uL (ref 150–400)
RBC: 4.37 MIL/uL (ref 3.87–5.11)
RDW: 14.1 % (ref 11.5–15.5)
WBC: 10.5 10*3/uL (ref 4.0–10.5)

## 2017-05-21 MED ORDER — BUTALBITAL-APAP-CAFFEINE 50-325-40 MG PO TABS
1.0000 | ORAL_TABLET | Freq: Once | ORAL | Status: AC
  Administered 2017-05-21: 1 via ORAL
  Filled 2017-05-21: qty 1

## 2017-05-21 MED ORDER — BETAMETHASONE SOD PHOS & ACET 6 (3-3) MG/ML IJ SUSP
12.0000 mg | INTRAMUSCULAR | Status: DC
  Administered 2017-05-21: 12 mg via INTRAMUSCULAR
  Filled 2017-05-21: qty 2

## 2017-05-21 MED ORDER — BUTALBITAL-APAP-CAFFEINE 50-325-40 MG PO TABS: 1.0000 | ORAL_TABLET | Freq: Four times a day (QID) | ORAL | 0 refills | Status: AC | PRN

## 2017-05-21 NOTE — MAU Provider Note (Signed)
Chief Complaint:  Headache and Hypertension   None    HPI: [name] [name] is a 37 y.o. G2P1001 at [redacted]w[redacted]d who presents to maternity admissions reporting headache and high BP today.  Pt has hx of chronic hypertension on Procardia 60mg  XL  Pt took medication at 0830 this am.  Hx of prior LTCS for FTP.  Had negative pre eclamptic work up last week.  Took Tylenol at 1130 with no relief.  Pt states she has a history of Migraines and hypothyroidism.  FM +  Denies contractions or leaking of fluid.  Location: left sided headache. Quality: pounding Severity: 6/10 in pain scale Duration: today Context:light sensitive  Pregnancy Course:   Past Medical History:  Diagnosis Date  . Hypertension   . Hypothyroidism   . Migraine   . Morbid obesity (HCC)   . MRSA (methicillin resistant Staphylococcus aureus)   . Urinary tract infection   . Vaginal Pap smear, abnormal    OB History  Gravida Para Term Preterm AB Living  2 1 1     1   SAB TAB Ectopic Multiple Live Births               # Outcome Date GA Lbr Len/2nd Weight Sex Delivery Anes PTL Lv  2 Current           1 Term      CS-Unspec      Past Surgical History:  Procedure Laterality Date  . CESAREAN SECTION    . CHOLECYSTECTOMY    . LAPAROSCOPIC GASTRIC BANDING    . TONSILLECTOMY     History reviewed. No pertinent family history. Social History   Tobacco Use  . Smoking status: Former Games developer  . Smokeless tobacco: Never Used  Substance Use Topics  . Alcohol use: No  . Drug use: No   Allergies  Allergen Reactions  . Fish Allergy Anaphylaxis  . Escitalopram Oxalate Hives  . Penicillins Hives and Swelling  . Pineapple     Rash and tongue swells   Medications Prior to Admission  Medication Sig Dispense Refill Last Dose  . hydrocodone-ibuprofen (VICOPROFEN) 5-200 MG per tablet Take 1 tablet by mouth every 6 (six) hours as needed.     Not Taking  . Levothyroxine Sodium 100 MCG CAPS Take 1 capsule by mouth daily.   Taking  .  NIFEdipine (PROCARDIA-XL/ADALAT-CC/NIFEDICAL-XL) 30 MG 24 hr tablet Take 30 mg by mouth daily.   Taking  . Prenatal Vit-Fe Fumarate-FA (PRENATAL VITAMIN PO) Take by mouth.   Taking    I have reviewed patient's Past Medical Hx, Surgical Hx, Family Hx, Social Hx, medications and allergies.   ROS:  Review of Systems  Constitutional: Negative.   HENT: Negative.   Eyes: Positive for photophobia.  Respiratory: Negative.   Cardiovascular: Negative.   Gastrointestinal: Negative.   Endocrine: Negative.   Genitourinary: Negative.   Musculoskeletal: Negative.   Skin: Negative.   Allergic/Immunologic: Negative.   Neurological: Positive for headaches.  Hematological: Negative.   Psychiatric/Behavioral: Negative.     Physical Exam   Patient Vitals for the past 24 hrs:  BP Temp Temp src Pulse Resp SpO2 Weight  05/21/17 2011 (!) 147/92 - - 96 - - -  05/21/17 2010 (!) 175/94 - - 95 - - -  05/21/17 2002 (!) 171/97 - - 99 - - -  05/21/17 2000 (!) 168/99 - - 100 - - -  05/21/17 1950 (!) 163/98 - - 100 - - -  05/21/17 1940 (!) 155/87 - - Marland Kitchen)  101 - - -  05/21/17 1930 (!) 154/88 - - 98 - - -  05/21/17 1918 (!) 149/88 - - (!) 103 - - -  05/21/17 1856 (!) 179/102 98.5 F (36.9 C) Oral (!) 103 18 98 % (!) 176.8 kg (389 lb 12 oz)   Constitutional: Well-developed, well-nourished female in no acute distress.  Cardiovascular: normal rate Respiratory: normal effort GI: Abd soft, non-tender, gravid appropriate for gestational age. Pos BS x 4 MS: Extremities nontender, slight edema, normal ROM, +2/+2 reflexes, neg clonus Neurologic: Alert and oriented x 4.  GU: Deferred      FHT:  Baseline 145 , moderate variability, accelerations present, no decelerations Contractions: None   Labs: Results for orders placed or performed during the hospital encounter of 05/21/17 (from the past 24 hour(s))  CBC     Status: Abnormal   Collection Time: 05/21/17  7:59 PM  Result Value Ref Range   WBC 10.5 4.0 -  10.5 K/uL   RBC 4.37 3.87 - 5.11 MIL/uL   Hemoglobin 11.9 (L) 12.0 - 15.0 g/dL   HCT 16.1 (L) 09.6 - 04.5 %   MCV 75.1 (L) 78.0 - 100.0 fL   MCH 27.2 26.0 - 34.0 pg   MCHC 36.3 (H) 30.0 - 36.0 g/dL   RDW 40.9 81.1 - 91.4 %   Platelets 158 150 - 400 K/uL   Results for orders placed or performed during the hospital encounter of 05/21/17 (from the past 24 hour(s))  Protein / creatinine ratio, urine     Status: Abnormal   Collection Time: 05/21/17  7:00 PM  Result Value Ref Range   Creatinine, Urine 124.00 mg/dL   Total Protein, Urine 32 mg/dL   Protein Creatinine Ratio 0.26 (H) 0.00 - 0.15 mg/mg[Cre]  CBC     Status: Abnormal   Collection Time: 05/21/17  7:59 PM  Result Value Ref Range   WBC 10.5 4.0 - 10.5 K/uL   RBC 4.37 3.87 - 5.11 MIL/uL   Hemoglobin 11.9 (L) 12.0 - 15.0 g/dL   HCT 78.2 (L) 95.6 - 21.3 %   MCV 75.1 (L) 78.0 - 100.0 fL   MCH 27.2 26.0 - 34.0 pg   MCHC 36.3 (H) 30.0 - 36.0 g/dL   RDW 08.6 57.8 - 46.9 %   Platelets 158 150 - 400 K/uL  Comprehensive metabolic panel     Status: Abnormal   Collection Time: 05/21/17  7:59 PM  Result Value Ref Range   Sodium 133 (L) 135 - 145 mmol/L   Potassium 3.3 (L) 3.5 - 5.1 mmol/L   Chloride 102 101 - 111 mmol/L   CO2 20 (L) 22 - 32 mmol/L   Glucose, Bld 87 65 - 99 mg/dL   BUN <5 (L) 6 - 20 mg/dL   Creatinine, Ser 6.29 0.44 - 1.00 mg/dL   Calcium 8.8 (L) 8.9 - 10.3 mg/dL   Total Protein 7.5 6.5 - 8.1 g/dL   Albumin 3.2 (L) 3.5 - 5.0 g/dL   AST 23 15 - 41 U/L   ALT 16 14 - 54 U/L   Alkaline Phosphatase 67 38 - 126 U/L   Total Bilirubin 0.5 0.3 - 1.2 mg/dL   GFR calc non Af Amer >60 >60 mL/min   GFR calc Af Amer >60 >60 mL/min   Anion gap 11 5 - 15   Imaging:    MAU Course: Orders Placed This Encounter  Procedures  . CBC  . Comprehensive metabolic panel  . Protein / creatinine  ratio, urine   Meds ordered this encounter  Medications  . butalbital-acetaminophen-caffeine (FIORICET, ESGIC) 50-325-40 MG per tablet  1 tablet    MDM: PE, Labs reviewed.  Fioricet for headache.  NST reactive.  Discussed lab results and BP with Dr. Sallye OberKulwa. Will give pt betamethasone and have her call office tomorrow.  Headache gone after Fioricet.  Assessment: 35 week IUP with chronic hypertension Hx of Migraines Previous LTCS Cat 1 strip  Plan: Discussed evaluation with Dr. Sallye OberKulwa  Will discharge pt home.  Will have follow up in office next week.  Will return to MAU for second betamethasone.   Discharge home in stable condition.  Labor precautions and fetal kick counts   Procardia 60mg  XL daily PNV  [name] [name], [name] [name], CNM 05/21/2017 8:27 PM

## 2017-05-21 NOTE — MAU Note (Addendum)
Has a HA, thinks it is a migraine.  BP is out the wazoo(168/103)since the HA started. Sent over for further eval. Has CHTN, took med this morning at 0830. Took 2 ES Tylenol at 1130, no real help. Denies visual changes, epigastric pain or increase in swelling (ankles were real swoll last night, has gone down)

## 2017-05-21 NOTE — Discharge Instructions (Signed)

## 2017-05-21 NOTE — MAU Note (Signed)
Urine in lab 

## 2017-05-22 ENCOUNTER — Inpatient Hospital Stay (HOSPITAL_COMMUNITY)
Admission: AD | Admit: 2017-05-22 | Discharge: 2017-05-22 | Disposition: A | Discharge status: Home / Self Care | Payer: Managed Care, Other (non HMO) | Source: Ambulatory Visit | Attending: Obstetrics and Gynecology | Admitting: Obstetrics and Gynecology

## 2017-05-22 DIAGNOSIS — O10013 Pre-existing essential hypertension complicating pregnancy, third trimester: Secondary | ICD-10-CM | POA: Diagnosis present

## 2017-05-22 DIAGNOSIS — Z3A35 35 weeks gestation of pregnancy: Secondary | ICD-10-CM | POA: Diagnosis not present

## 2017-05-22 MED ORDER — BETAMETHASONE SOD PHOS & ACET 6 (3-3) MG/ML IJ SUSP
12.0000 mg | Freq: Once | INTRAMUSCULAR | Status: AC
  Administered 2017-05-22: 12 mg via INTRAMUSCULAR
  Filled 2017-05-22: qty 2

## 2017-05-22 NOTE — Progress Notes (Signed)
Bernerd PhoNancy Prothero CNM notified of pt's B/Ps at return visit for 2nd BMZ injection. Aware pt feels fine and no complaints. Pt stable for d/c home after BMZ inj and keep appt next Weds as scheduled.

## 2017-05-27 ENCOUNTER — Encounter (HOSPITAL_COMMUNITY): Payer: Self-pay

## 2017-05-27 ENCOUNTER — Ambulatory Visit (HOSPITAL_COMMUNITY)
Admission: RE | Admit: 2017-05-27 | Discharge: 2017-05-27 | Disposition: A | Discharge status: Home / Self Care | Payer: Managed Care, Other (non HMO) | Source: Ambulatory Visit | Attending: Obstetrics and Gynecology | Admitting: Obstetrics and Gynecology

## 2017-05-27 DIAGNOSIS — O10013 Pre-existing essential hypertension complicating pregnancy, third trimester: Secondary | ICD-10-CM | POA: Diagnosis not present

## 2017-05-27 DIAGNOSIS — O99213 Obesity complicating pregnancy, third trimester: Secondary | ICD-10-CM | POA: Insufficient documentation

## 2017-05-27 DIAGNOSIS — Z3A35 35 weeks gestation of pregnancy: Secondary | ICD-10-CM | POA: Insufficient documentation

## 2017-05-27 DIAGNOSIS — E039 Hypothyroidism, unspecified: Secondary | ICD-10-CM | POA: Insufficient documentation

## 2017-05-27 DIAGNOSIS — O34219 Maternal care for unspecified type scar from previous cesarean delivery: Secondary | ICD-10-CM | POA: Insufficient documentation

## 2017-05-27 DIAGNOSIS — O09523 Supervision of elderly multigravida, third trimester: Secondary | ICD-10-CM | POA: Insufficient documentation

## 2017-05-27 DIAGNOSIS — I10 Essential (primary) hypertension: Secondary | ICD-10-CM

## 2017-05-27 DIAGNOSIS — O99283 Endocrine, nutritional and metabolic diseases complicating pregnancy, third trimester: Secondary | ICD-10-CM | POA: Insufficient documentation

## 2017-06-03 ENCOUNTER — Other Ambulatory Visit (HOSPITAL_COMMUNITY): Payer: Self-pay | Admitting: Obstetrics and Gynecology

## 2017-06-03 ENCOUNTER — Encounter (HOSPITAL_COMMUNITY): Payer: Self-pay

## 2017-06-03 ENCOUNTER — Ambulatory Visit (HOSPITAL_COMMUNITY)
Admission: RE | Admit: 2017-06-03 | Discharge: 2017-06-03 | Disposition: A | Discharge status: Home / Self Care | Payer: Managed Care, Other (non HMO) | Source: Ambulatory Visit | Attending: Obstetrics and Gynecology | Admitting: Obstetrics and Gynecology

## 2017-06-03 DIAGNOSIS — Z3A36 36 weeks gestation of pregnancy: Secondary | ICD-10-CM

## 2017-06-03 DIAGNOSIS — E039 Hypothyroidism, unspecified: Secondary | ICD-10-CM

## 2017-06-03 DIAGNOSIS — O34219 Maternal care for unspecified type scar from previous cesarean delivery: Secondary | ICD-10-CM

## 2017-06-03 DIAGNOSIS — O99213 Obesity complicating pregnancy, third trimester: Secondary | ICD-10-CM

## 2017-06-03 DIAGNOSIS — O09523 Supervision of elderly multigravida, third trimester: Secondary | ICD-10-CM | POA: Diagnosis not present

## 2017-06-03 DIAGNOSIS — E0590 Thyrotoxicosis, unspecified without thyrotoxic crisis or storm: Secondary | ICD-10-CM

## 2017-06-03 DIAGNOSIS — O10013 Pre-existing essential hypertension complicating pregnancy, third trimester: Secondary | ICD-10-CM | POA: Insufficient documentation

## 2017-06-03 DIAGNOSIS — O99283 Endocrine, nutritional and metabolic diseases complicating pregnancy, third trimester: Secondary | ICD-10-CM | POA: Diagnosis not present

## 2017-06-03 DIAGNOSIS — I10 Essential (primary) hypertension: Secondary | ICD-10-CM

## 2017-06-04 ENCOUNTER — Encounter (HOSPITAL_COMMUNITY): Payer: Self-pay

## 2017-06-05 ENCOUNTER — Telehealth (HOSPITAL_COMMUNITY): Payer: Self-pay | Admitting: *Deleted

## 2017-06-05 NOTE — Telephone Encounter (Signed)
Preadmission screen  

## 2017-06-08 ENCOUNTER — Encounter (HOSPITAL_COMMUNITY): Payer: Self-pay

## 2017-06-10 ENCOUNTER — Ambulatory Visit (HOSPITAL_COMMUNITY)
Admission: RE | Admit: 2017-06-10 | Discharge: 2017-06-10 | Disposition: A | Discharge status: Home / Self Care | Payer: Managed Care, Other (non HMO) | Source: Ambulatory Visit | Attending: Obstetrics and Gynecology | Admitting: Obstetrics and Gynecology

## 2017-06-10 ENCOUNTER — Other Ambulatory Visit (HOSPITAL_COMMUNITY): Payer: Self-pay | Admitting: Obstetrics and Gynecology

## 2017-06-10 ENCOUNTER — Encounter (HOSPITAL_COMMUNITY): Payer: Self-pay

## 2017-06-10 DIAGNOSIS — I10 Essential (primary) hypertension: Secondary | ICD-10-CM

## 2017-06-10 DIAGNOSIS — Z3A37 37 weeks gestation of pregnancy: Secondary | ICD-10-CM

## 2017-06-10 DIAGNOSIS — O10013 Pre-existing essential hypertension complicating pregnancy, third trimester: Secondary | ICD-10-CM | POA: Insufficient documentation

## 2017-06-10 DIAGNOSIS — O34219 Maternal care for unspecified type scar from previous cesarean delivery: Secondary | ICD-10-CM | POA: Insufficient documentation

## 2017-06-10 DIAGNOSIS — O09523 Supervision of elderly multigravida, third trimester: Secondary | ICD-10-CM | POA: Insufficient documentation

## 2017-06-10 DIAGNOSIS — E039 Hypothyroidism, unspecified: Secondary | ICD-10-CM | POA: Insufficient documentation

## 2017-06-10 DIAGNOSIS — O99213 Obesity complicating pregnancy, third trimester: Secondary | ICD-10-CM | POA: Diagnosis not present

## 2017-06-10 DIAGNOSIS — O10019 Pre-existing essential hypertension complicating pregnancy, unspecified trimester: Secondary | ICD-10-CM | POA: Diagnosis present

## 2017-06-10 DIAGNOSIS — O99283 Endocrine, nutritional and metabolic diseases complicating pregnancy, third trimester: Secondary | ICD-10-CM | POA: Diagnosis not present

## 2017-06-12 ENCOUNTER — Encounter (HOSPITAL_COMMUNITY): Admission: RE | Admit: 2017-06-12 | Payer: Managed Care, Other (non HMO) | Source: Ambulatory Visit

## 2017-06-12 NOTE — Patient Instructions (Addendum)
Eastyn Fan  06/12/2017   Your procedure is scheduled on:  06/16/2017  Enter through the Main [facility]tr[facility] of [facility] at 3:45 PM.  Pick up the phone at the desk and dial [phone]  Call this number if you have problems the morning of surgery:[phone]  Remember:   Do not eat food:(After Midnight) Desps de medianoche.  Do not drink clear liquids: (After Midnight) Desps de medianoche.  Take these medicines the morning of surgery with A SIP OF WATER: Take your synthroid and procardia as directed.   Do not wear jewelry, make-up or nail polish.  Do not wear lotions, powders, or perfumes. Do not wear deodorant.  Do not shave 48 hours prior to surgery.  Do not bring valuables to the hospital.  [facility] is not   responsible for any belongings or valuables brought to the hospital.  Contacts, dentures or bridgework may not be worn into surgery.  Leave suitcase in the car. After surgery it may be brought to your room.  For patients admitted to the hospital, checkout time is 11:00 AM the day of              discharge.    N/A   Please read over the following fact sheets that you were given:   Surgical Site Infection Prevention

## 2017-06-15 ENCOUNTER — Encounter (HOSPITAL_COMMUNITY)
Admission: RE | Admit: 2017-06-15 | Discharge: 2017-06-15 | Disposition: A | Discharge status: Home / Self Care | Payer: Managed Care, Other (non HMO) | Source: Ambulatory Visit | Attending: Obstetrics and Gynecology | Admitting: Obstetrics and Gynecology

## 2017-06-15 LAB — COMPREHENSIVE METABOLIC PANEL
ALBUMIN: 3.1 g/dL — AB (ref 3.5–5.0)
ALT: 17 U/L (ref 14–54)
AST: 23 U/L (ref 15–41)
Alkaline Phosphatase: 84 U/L (ref 38–126)
Anion gap: 9 (ref 5–15)
BUN: 6 mg/dL (ref 6–20)
CO2: 23 mmol/L (ref 22–32)
CREATININE: 0.71 mg/dL (ref 0.44–1.00)
Calcium: 8.9 mg/dL (ref 8.9–10.3)
Chloride: 105 mmol/L (ref 101–111)
GFR calc Af Amer: >60 mL/min (ref >60)
GFR calc non Af Amer: >60 mL/min (ref >60)
GLUCOSE: 91 mg/dL (ref 65–99)
POTASSIUM: 3.4 mmol/L — AB (ref 3.5–5.1)
SODIUM: 137 mmol/L (ref 135–145)
Total Bilirubin: 0.7 mg/dL (ref 0.3–1.2)
Total Protein: 7.4 g/dL (ref 6.5–8.1)

## 2017-06-15 LAB — TYPE AND SCREEN
ABO/RH(D): B POS
Antibody Screen: NEGATIVE

## 2017-06-15 LAB — CBC
HCT: 32.3 % — ABNORMAL LOW (ref 36.0–46.0)
Hemoglobin: 11.4 g/dL — ABNORMAL LOW (ref 12.0–15.0)
MCH: 26.6 pg (ref 26.0–34.0)
MCHC: 35.3 g/dL (ref 30.0–36.0)
MCV: 75.5 fL — AB (ref 78.0–100.0)
PLATELETS: 162 10*3/uL (ref 150–400)
RBC: 4.28 MIL/uL (ref 3.87–5.11)
RDW: 14.4 % (ref 11.5–15.5)
WBC: 9.4 10*3/uL (ref 4.0–10.5)

## 2017-06-15 LAB — T4, FREE: FREE T4: 0.84 ng/dL (ref 0.61–1.12)

## 2017-06-15 LAB — TSH: TSH: 1.659 u[IU]/mL (ref 0.350–4.500)

## 2017-06-15 LAB — ABO/RH: ABO/RH(D): B POS

## 2017-06-16 ENCOUNTER — Encounter (HOSPITAL_COMMUNITY): Payer: Self-pay

## 2017-06-16 ENCOUNTER — Inpatient Hospital Stay (HOSPITAL_COMMUNITY): Payer: Managed Care, Other (non HMO) | Admitting: Certified Registered Nurse Anesthetist

## 2017-06-16 ENCOUNTER — Inpatient Hospital Stay (HOSPITAL_COMMUNITY)
Admission: RE | Admit: 2017-06-16 | Discharge: 2017-06-20 | DRG: 787 | Disposition: A | Discharge status: Home / Self Care | Payer: Managed Care, Other (non HMO) | Source: Ambulatory Visit | Attending: Obstetrics and Gynecology | Admitting: Obstetrics and Gynecology

## 2017-06-16 ENCOUNTER — Encounter (HOSPITAL_COMMUNITY)
Admission: RE | Disposition: A | Discharge status: Home / Self Care | Payer: Self-pay | Source: Ambulatory Visit | Attending: Obstetrics and Gynecology

## 2017-06-16 DIAGNOSIS — O99214 Obesity complicating childbirth: Secondary | ICD-10-CM | POA: Diagnosis present

## 2017-06-16 DIAGNOSIS — O114 Pre-existing hypertension with pre-eclampsia, complicating childbirth: Secondary | ICD-10-CM | POA: Diagnosis present

## 2017-06-16 DIAGNOSIS — Z87891 Personal history of nicotine dependence: Secondary | ICD-10-CM

## 2017-06-16 DIAGNOSIS — E039 Hypothyroidism, unspecified: Secondary | ICD-10-CM | POA: Diagnosis present

## 2017-06-16 DIAGNOSIS — O34211 Maternal care for low transverse scar from previous cesarean delivery: Principal | ICD-10-CM | POA: Diagnosis present

## 2017-06-16 DIAGNOSIS — Z98891 History of uterine scar from previous surgery: Secondary | ICD-10-CM

## 2017-06-16 DIAGNOSIS — Z880 Allergy status to penicillin: Secondary | ICD-10-CM | POA: Diagnosis not present

## 2017-06-16 DIAGNOSIS — Z3A38 38 weeks gestation of pregnancy: Secondary | ICD-10-CM | POA: Diagnosis not present

## 2017-06-16 DIAGNOSIS — O99284 Endocrine, nutritional and metabolic diseases complicating childbirth: Secondary | ICD-10-CM | POA: Diagnosis present

## 2017-06-16 DIAGNOSIS — Z8614 Personal history of Methicillin resistant Staphylococcus aureus infection: Secondary | ICD-10-CM | POA: Diagnosis not present

## 2017-06-16 DIAGNOSIS — O1002 Pre-existing essential hypertension complicating childbirth: Secondary | ICD-10-CM | POA: Diagnosis present

## 2017-06-16 LAB — RPR: RPR: NONREACTIVE

## 2017-06-16 SURGERY — Surgical Case
Anesthesia: Spinal

## 2017-06-16 MED ORDER — PROMETHAZINE HCL 25 MG/ML IJ SOLN
12.5000 mg | Freq: Four times a day (QID) | INTRAMUSCULAR | Status: DC | PRN
  Filled 2017-06-16: qty 1

## 2017-06-16 MED ORDER — METOCLOPRAMIDE HCL 5 MG/ML IJ SOLN: 10.0000 mg | Freq: Once | INTRAMUSCULAR | Status: DC | PRN

## 2017-06-16 MED ORDER — SODIUM CHLORIDE 0.9% FLUSH: 3.0000 mL | INTRAVENOUS | Status: DC | PRN

## 2017-06-16 MED ORDER — PHENYLEPHRINE 8 MG IN D5W 100 ML (0.08MG/ML) PREMIX OPTIME
INJECTION | INTRAVENOUS | Status: DC | PRN
  Administered 2017-06-16: 60 ug/min via INTRAVENOUS

## 2017-06-16 MED ORDER — NALBUPHINE HCL 10 MG/ML IJ SOLN
5.0000 mg | INTRAMUSCULAR | Status: DC | PRN
  Filled 2017-06-16: qty 1

## 2017-06-16 MED ORDER — PHENYLEPHRINE 40 MCG/ML (10ML) SYRINGE FOR IV PUSH (FOR BLOOD PRESSURE SUPPORT)
PREFILLED_SYRINGE | INTRAVENOUS | Status: AC
  Filled 2017-06-16: qty 10

## 2017-06-16 MED ORDER — DIPHENHYDRAMINE HCL 25 MG PO CAPS
25.0000 mg | ORAL_CAPSULE | ORAL | Status: DC | PRN
  Filled 2017-06-16: qty 1

## 2017-06-16 MED ORDER — WITCH HAZEL-GLYCERIN EX PADS: 1.0000 "application " | MEDICATED_PAD | CUTANEOUS | Status: DC | PRN

## 2017-06-16 MED ORDER — DEXAMETHASONE SODIUM PHOSPHATE 4 MG/ML IJ SOLN
INTRAMUSCULAR | Status: DC | PRN
  Administered 2017-06-16: 4 mg via INTRAVENOUS

## 2017-06-16 MED ORDER — LEVOTHYROXINE SODIUM 200 MCG PO TABS
200.0000 ug | ORAL_TABLET | Freq: Every day | ORAL | Status: DC
  Administered 2017-06-17 – 2017-06-20 (×4): 200 ug via ORAL
  Filled 2017-06-16 (×5): qty 1

## 2017-06-16 MED ORDER — ACETAMINOPHEN 325 MG PO TABS
650.0000 mg | ORAL_TABLET | ORAL | Status: DC | PRN
  Administered 2017-06-17 – 2017-06-19 (×3): 650 mg via ORAL
  Filled 2017-06-16 (×4): qty 2

## 2017-06-16 MED ORDER — NALBUPHINE HCL 10 MG/ML IJ SOLN
INTRAMUSCULAR | Status: AC
  Filled 2017-06-16: qty 1

## 2017-06-16 MED ORDER — SOD CITRATE-CITRIC ACID 500-334 MG/5ML PO SOLN
30.0000 mL | Freq: Once | ORAL | Status: AC
  Administered 2017-06-16: 30 mL via ORAL

## 2017-06-16 MED ORDER — BUPIVACAINE IN DEXTROSE 0.75-8.25 % IT SOLN
INTRATHECAL | Status: DC | PRN
  Administered 2017-06-16: 1.6 mL via INTRATHECAL

## 2017-06-16 MED ORDER — MEPERIDINE HCL 25 MG/ML IJ SOLN: 6.2500 mg | INTRAMUSCULAR | Status: DC | PRN

## 2017-06-16 MED ORDER — HYDRALAZINE HCL 20 MG/ML IJ SOLN: 10.0000 mg | Freq: Once | INTRAMUSCULAR | Status: DC | PRN

## 2017-06-16 MED ORDER — TETANUS-DIPHTH-ACELL PERTUSSIS 5-2.5-18.5 LF-MCG/0.5 IM SUSP: 0.5000 mL | Freq: Once | INTRAMUSCULAR | Status: DC

## 2017-06-16 MED ORDER — SIMETHICONE 80 MG PO CHEW: 80.0000 mg | CHEWABLE_TABLET | ORAL | Status: DC | PRN

## 2017-06-16 MED ORDER — DIBUCAINE 1 % RE OINT: 1.0000 "application " | TOPICAL_OINTMENT | RECTAL | Status: DC | PRN

## 2017-06-16 MED ORDER — ONDANSETRON HCL 4 MG/2ML IJ SOLN
INTRAMUSCULAR | Status: AC
  Filled 2017-06-16: qty 2

## 2017-06-16 MED ORDER — NALBUPHINE HCL 10 MG/ML IJ SOLN
5.0000 mg | INTRAMUSCULAR | Status: DC | PRN
Start: 2017-06-16 — End: 2017-06-20
  Administered 2017-06-16 – 2017-06-17 (×3): 5 mg via INTRAVENOUS
  Filled 2017-06-16 (×2): qty 1

## 2017-06-16 MED ORDER — SIMETHICONE 80 MG PO CHEW
80.0000 mg | CHEWABLE_TABLET | ORAL | Status: DC
  Administered 2017-06-16 – 2017-06-17 (×2): 80 mg via ORAL
  Filled 2017-06-16 (×2): qty 1

## 2017-06-16 MED ORDER — MENTHOL 3 MG MT LOZG: 1.0000 | LOZENGE | OROMUCOSAL | Status: DC | PRN

## 2017-06-16 MED ORDER — FENTANYL CITRATE (PF) 100 MCG/2ML IJ SOLN
25.0000 ug | INTRAMUSCULAR | Status: DC | PRN
  Administered 2017-06-16: 50 ug via INTRAVENOUS

## 2017-06-16 MED ORDER — LIDOCAINE HCL 1 % IJ SOLN
INTRAMUSCULAR | Status: AC
  Filled 2017-06-16: qty 20

## 2017-06-16 MED ORDER — ACETAMINOPHEN 500 MG PO TABS
1000.0000 mg | ORAL_TABLET | Freq: Four times a day (QID) | ORAL | Status: AC
  Administered 2017-06-16 (×2): 1000 mg via ORAL
  Filled 2017-06-16 (×3): qty 2

## 2017-06-16 MED ORDER — OXYTOCIN 40 UNITS IN LACTATED RINGERS INFUSION - SIMPLE MED
2.5000 [IU]/h | INTRAVENOUS | Status: AC
  Administered 2017-06-16: 2.5 [IU]/h via INTRAVENOUS

## 2017-06-16 MED ORDER — GENTAMICIN SULFATE 40 MG/ML IJ SOLN
INTRAVENOUS | Status: AC
  Administered 2017-06-16: 119 mL via INTRAVENOUS
  Filled 2017-06-16: qty 13.75

## 2017-06-16 MED ORDER — LACTATED RINGERS IV SOLN: INTRAVENOUS | Status: DC

## 2017-06-16 MED ORDER — OXYTOCIN 10 UNIT/ML IJ SOLN
INTRAVENOUS | Status: DC | PRN
  Administered 2017-06-16: 40 [IU] via INTRAVENOUS

## 2017-06-16 MED ORDER — NALOXONE HCL 0.4 MG/ML IJ SOLN: 0.4000 mg | INTRAMUSCULAR | Status: DC | PRN

## 2017-06-16 MED ORDER — LACTATED RINGERS IV SOLN: INTRAVENOUS | Status: DC | PRN

## 2017-06-16 MED ORDER — FENTANYL CITRATE (PF) 100 MCG/2ML IJ SOLN
INTRAMUSCULAR | Status: DC | PRN
  Administered 2017-06-16: 10 ug via INTRATHECAL

## 2017-06-16 MED ORDER — ONDANSETRON HCL 4 MG/2ML IJ SOLN
4.0000 mg | Freq: Three times a day (TID) | INTRAMUSCULAR | Status: DC | PRN
  Filled 2017-06-16: qty 2

## 2017-06-16 MED ORDER — OXYTOCIN 10 UNIT/ML IJ SOLN
INTRAMUSCULAR | Status: AC
  Filled 2017-06-16: qty 4

## 2017-06-16 MED ORDER — ZOLPIDEM TARTRATE 5 MG PO TABS: 5.0000 mg | ORAL_TABLET | Freq: Every evening | ORAL | Status: DC | PRN

## 2017-06-16 MED ORDER — LOPERAMIDE HCL 2 MG PO CAPS
2.0000 mg | ORAL_CAPSULE | Freq: Every day | ORAL | Status: DC
  Filled 2017-06-16 (×4): qty 1

## 2017-06-16 MED ORDER — NALBUPHINE HCL 10 MG/ML IJ SOLN: 5.0000 mg | Freq: Once | INTRAMUSCULAR | Status: AC | PRN

## 2017-06-16 MED ORDER — LABETALOL HCL 5 MG/ML IV SOLN
INTRAVENOUS | Status: AC
  Filled 2017-06-16: qty 4

## 2017-06-16 MED ORDER — NALBUPHINE HCL 10 MG/ML IJ SOLN: 5.0000 mg | INTRAMUSCULAR | Status: DC | PRN

## 2017-06-16 MED ORDER — ONDANSETRON HCL 4 MG/2ML IJ SOLN
INTRAMUSCULAR | Status: DC | PRN
  Administered 2017-06-16: 4 mg via INTRAVENOUS

## 2017-06-16 MED ORDER — MISOPROSTOL 200 MCG PO TABS: 800.0000 ug | ORAL_TABLET | Freq: Once | ORAL | Status: DC | PRN

## 2017-06-16 MED ORDER — FENTANYL CITRATE (PF) 100 MCG/2ML IJ SOLN
INTRAMUSCULAR | Status: AC
  Filled 2017-06-16: qty 2

## 2017-06-16 MED ORDER — MORPHINE SULFATE (PF) 0.5 MG/ML IJ SOLN
INTRAMUSCULAR | Status: DC | PRN
  Administered 2017-06-16: .2 mg via INTRATHECAL

## 2017-06-16 MED ORDER — NIFEDIPINE ER OSMOTIC RELEASE 30 MG PO TB24: 60.0000 mg | ORAL_TABLET | Freq: Every day | ORAL | Status: DC

## 2017-06-16 MED ORDER — SODIUM CHLORIDE 0.9 % IR SOLN
Status: DC | PRN
  Administered 2017-06-16: 1

## 2017-06-16 MED ORDER — MORPHINE SULFATE (PF) 0.5 MG/ML IJ SOLN
INTRAMUSCULAR | Status: AC
  Filled 2017-06-16: qty 10

## 2017-06-16 MED ORDER — SOD CITRATE-CITRIC ACID 500-334 MG/5ML PO SOLN
ORAL | Status: AC
  Filled 2017-06-16: qty 15

## 2017-06-16 MED ORDER — SENNOSIDES-DOCUSATE SODIUM 8.6-50 MG PO TABS
2.0000 | ORAL_TABLET | ORAL | Status: DC
  Administered 2017-06-16 – 2017-06-18 (×3): 2 via ORAL
  Filled 2017-06-16 (×4): qty 2

## 2017-06-16 MED ORDER — SIMETHICONE 80 MG PO CHEW
80.0000 mg | CHEWABLE_TABLET | Freq: Three times a day (TID) | ORAL | Status: DC
  Administered 2017-06-16 – 2017-06-19 (×11): 80 mg via ORAL
  Filled 2017-06-16 (×12): qty 1

## 2017-06-16 MED ORDER — COCONUT OIL OIL
1.0000 "application " | TOPICAL_OIL | Status: DC | PRN
  Administered 2017-06-18: 1 via TOPICAL

## 2017-06-16 MED ORDER — DEXAMETHASONE SODIUM PHOSPHATE 4 MG/ML IJ SOLN
INTRAMUSCULAR | Status: AC
  Filled 2017-06-16: qty 1

## 2017-06-16 MED ORDER — SCOPOLAMINE 1 MG/3DAYS TD PT72: 1.0000 | MEDICATED_PATCH | Freq: Once | TRANSDERMAL | Status: DC

## 2017-06-16 MED ORDER — BUTALBITAL-APAP-CAFFEINE 50-325-40 MG PO TABS: 1.0000 | ORAL_TABLET | Freq: Four times a day (QID) | ORAL | Status: DC | PRN

## 2017-06-16 MED ORDER — ENOXAPARIN SODIUM 100 MG/ML ~~LOC~~ SOLN
90.0000 mg | SUBCUTANEOUS | Status: DC
  Administered 2017-06-18 – 2017-06-20 (×3): 90 mg via SUBCUTANEOUS
  Filled 2017-06-16 (×5): qty 1

## 2017-06-16 MED ORDER — NALOXONE HCL 4 MG/10ML IJ SOLN
1.0000 ug/kg/h | INTRAVENOUS | Status: DC | PRN
  Filled 2017-06-16: qty 5

## 2017-06-16 MED ORDER — NALBUPHINE HCL 10 MG/ML IJ SOLN
5.0000 mg | Freq: Once | INTRAMUSCULAR | Status: AC | PRN
  Administered 2017-06-16: 5 mg via SUBCUTANEOUS

## 2017-06-16 MED ORDER — PHENYLEPHRINE 8 MG IN D5W 100 ML (0.08MG/ML) PREMIX OPTIME
INJECTION | INTRAVENOUS | Status: AC
  Filled 2017-06-16: qty 100

## 2017-06-16 MED ORDER — LACTATED RINGERS IV SOLN
INTRAVENOUS | Status: DC
  Administered 2017-06-16 – 2017-06-17 (×3): via INTRAVENOUS

## 2017-06-16 MED ORDER — LABETALOL HCL 5 MG/ML IV SOLN
20.0000 mg | INTRAVENOUS | Status: DC | PRN
  Administered 2017-06-18 (×2): 20 mg via INTRAVENOUS
  Filled 2017-06-16: qty 8
  Filled 2017-06-16 (×2): qty 4

## 2017-06-16 MED ORDER — OXYCODONE HCL 5 MG PO TABS
5.0000 mg | ORAL_TABLET | ORAL | Status: DC | PRN
  Administered 2017-06-17 – 2017-06-19 (×3): 5 mg via ORAL
  Filled 2017-06-16 (×4): qty 1

## 2017-06-16 MED ORDER — LABETALOL HCL 5 MG/ML IV SOLN
20.0000 mg | INTRAVENOUS | Status: AC | PRN
  Administered 2017-06-16: 20 mg via INTRAVENOUS
  Administered 2017-06-16: 40 mg via INTRAVENOUS
  Administered 2017-06-19: 20 mg via INTRAVENOUS
  Filled 2017-06-16: qty 4

## 2017-06-16 MED ORDER — KETOROLAC TROMETHAMINE 30 MG/ML IJ SOLN
30.0000 mg | Freq: Four times a day (QID) | INTRAMUSCULAR | Status: AC | PRN
  Administered 2017-06-16: 30 mg via INTRAVENOUS
  Filled 2017-06-16: qty 1

## 2017-06-16 MED ORDER — DIPHENHYDRAMINE HCL 25 MG PO CAPS
25.0000 mg | ORAL_CAPSULE | Freq: Four times a day (QID) | ORAL | Status: DC | PRN
  Administered 2017-06-16 – 2017-06-17 (×3): 25 mg via ORAL
  Filled 2017-06-16 (×3): qty 1

## 2017-06-16 MED ORDER — DIPHENHYDRAMINE HCL 50 MG/ML IJ SOLN: 12.5000 mg | INTRAMUSCULAR | Status: DC | PRN

## 2017-06-16 MED ORDER — LACTATED RINGERS IV SOLN
INTRAVENOUS | Status: DC | PRN
  Administered 2017-06-16: 08:00:00 via INTRAVENOUS

## 2017-06-16 MED ORDER — EPINEPHRINE 0.3 MG/0.3ML IJ SOAJ
0.3000 mg | Freq: Once | INTRAMUSCULAR | Status: DC
  Filled 2017-06-16: qty 0.3

## 2017-06-16 MED ORDER — OXYCODONE HCL 5 MG PO TABS
10.0000 mg | ORAL_TABLET | ORAL | Status: DC | PRN
  Filled 2017-06-16: qty 2

## 2017-06-16 MED ORDER — LACTATED RINGERS IV SOLN
INTRAVENOUS | Status: DC
  Administered 2017-06-16 (×2): via INTRAVENOUS

## 2017-06-16 MED ORDER — KETOROLAC TROMETHAMINE 30 MG/ML IJ SOLN
30.0000 mg | Freq: Four times a day (QID) | INTRAMUSCULAR | Status: AC | PRN
  Administered 2017-06-16: 30 mg via INTRAMUSCULAR

## 2017-06-16 MED ORDER — IBUPROFEN 600 MG PO TABS
600.0000 mg | ORAL_TABLET | Freq: Four times a day (QID) | ORAL | Status: DC
  Administered 2017-06-16 – 2017-06-20 (×14): 600 mg via ORAL
  Filled 2017-06-16 (×14): qty 1

## 2017-06-16 SURGICAL SUPPLY — 41 items
BARRIER ADHS 3X4 INTERCEED (GAUZE/BANDAGES/DRESSINGS) ×3 IMPLANT
BENZOIN TINCTURE PRP APPL 2/3 (GAUZE/BANDAGES/DRESSINGS) IMPLANT
CHLORAPREP W/TINT 26ML (MISCELLANEOUS) ×3 IMPLANT
CLAMP CORD UMBIL (MISCELLANEOUS) IMPLANT
CLOSURE WOUND 1/2 X4 (GAUZE/BANDAGES/DRESSINGS)
CLOTH BEACON ORANGE TIMEOUT ST (SAFETY) ×3 IMPLANT
DERMABOND ADVANCED (GAUZE/BANDAGES/DRESSINGS)
DERMABOND ADVANCED .7 DNX12 (GAUZE/BANDAGES/DRESSINGS) IMPLANT
DRESSING DISP NPWT PICO 4X16 (MISCELLANEOUS) ×3 IMPLANT
DRSG OPSITE POSTOP 4X10 (GAUZE/BANDAGES/DRESSINGS) ×3 IMPLANT
ELECT REM PT RETURN 9FT ADLT (ELECTROSURGICAL) ×3
ELECTRODE REM PT RTRN 9FT ADLT (ELECTROSURGICAL) ×1 IMPLANT
EXTENDER TRAXI PANNICULUS (MISCELLANEOUS) ×1 IMPLANT
EXTRACTOR VACUUM BELL STYLE (SUCTIONS) IMPLANT
EXTRACTOR VACUUM M CUP 4 TUBE (SUCTIONS) ×2 IMPLANT
EXTRACTOR VACUUM M CUP 4' TUBE (SUCTIONS) ×1
GLOVE BIO SURGEON STRL SZ7 (GLOVE) ×3 IMPLANT
GLOVE BIOGEL PI IND STRL 7.0 (GLOVE) ×2 IMPLANT
GLOVE BIOGEL PI INDICATOR 7.0 (GLOVE) ×4
GOWN STRL REUS W/TWL LRG LVL3 (GOWN DISPOSABLE) ×6 IMPLANT
HOVERMATT SINGLE USE (MISCELLANEOUS) ×3 IMPLANT
KIT ABG SYR 3ML LUER SLIP (SYRINGE) IMPLANT
NEEDLE HYPO 25X5/8 SAFETYGLIDE (NEEDLE) IMPLANT
NS IRRIG 1000ML POUR BTL (IV SOLUTION) ×3 IMPLANT
PACK C SECTION WH (CUSTOM PROCEDURE TRAY) ×3 IMPLANT
PAD OB MATERNITY 4.3X12.25 (PERSONAL CARE ITEMS) ×3 IMPLANT
PENCIL SMOKE EVAC W/HOLSTER (ELECTROSURGICAL) ×3 IMPLANT
RTRCTR C-SECT PINK 25CM LRG (MISCELLANEOUS) ×3 IMPLANT
STRIP CLOSURE SKIN 1/2X4 (GAUZE/BANDAGES/DRESSINGS) IMPLANT
SUT CHROMIC 0 CTX 36 (SUTURE) IMPLANT
SUT MON AB 4-0 PS1 27 (SUTURE) ×3 IMPLANT
SUT PLAIN 0 NONE (SUTURE) IMPLANT
SUT PLAIN 2 0 XLH (SUTURE) ×6 IMPLANT
SUT VIC AB 0 CTX 36 (SUTURE) ×10
SUT VIC AB 0 CTX36XBRD ANBCTRL (SUTURE) ×5 IMPLANT
SUT VIC AB 2-0 CT1 27 (SUTURE) ×2
SUT VIC AB 2-0 CT1 TAPERPNT 27 (SUTURE) ×1 IMPLANT
SUT VICRYL 0 TIES 12 18 (SUTURE) ×3 IMPLANT
TOWEL OR 17X24 6PK STRL BLUE (TOWEL DISPOSABLE) ×3 IMPLANT
TRAXI PANNICULUS EXTENDER (MISCELLANEOUS) ×2
TRAY FOLEY W/BAG SLVR 14FR LF (SET/KITS/TRAYS/PACK) IMPLANT

## 2017-06-16 NOTE — Brief Op Note (Signed)
06/16/2017  9:00 AM  PATIENT:  Lindalou HoseShantise Athens  37 y.o. female  PRE-OPERATIVE DIAGNOSIS:  IUP @ 38 5/7 weeks, H/o previous c-section, desires repeat, CHTN, Morbid obesity  POST-OPERATIVE DIAGNOSIS:  Same  PROCEDURE:  Procedure(s) with comments: REPEAT CESAREAN SECTION (N/A) - EDD 06/25/17 (LTCS with 2 layer closure)  SURGEON:  Surgeon(s) and Role:    Geryl Rankins* Shaquinta Peruski, MD - Primary    * Myna Hidalgozan, Jennifer, DO - Assisting  PHYSICIAN ASSISTANT:   ASSISTANTS: Dr. Charlotta Newtonzan   ANESTHESIA:   spinal  EBL:  895 mL   BLOOD ADMINISTERED:none  DRAINS: Urinary Catheter (Foley)   LOCAL MEDICATIONS USED:  LIDOCAINE  and Amount: 10 ml  SPECIMEN:  No Specimen  DISPOSITION OF SPECIMEN:  PATHOLOGY  COUNTS:  YES  TOURNIQUET:  * No tourniquets in log *  DICTATION: .Other Dictation: Dictation Number [id number]  PLAN OF CARE: Admit to inpatient   PATIENT DISPOSITION:  PACU - hemodynamically stable.   Delay start of Pharmacological VTE agent (>24hrs) due to surgical blood loss or risk of bleeding: no

## 2017-06-16 NOTE — Transfer of Care (Signed)
Immediate Anesthesia Transfer of Care Note  Patient: Lindalou HoseShantise Sapp  Procedure(s) Performed: REPEAT CESAREAN SECTION (N/A )  Patient Location: PACU  Anesthesia Type:Spinal  Level of Consciousness: awake, alert  and oriented  Airway & Oxygen Therapy: Patient Spontanous Breathing  Post-op Assessment: Report given to RN and Post -op Vital signs reviewed and stable  Post vital signs: Reviewed and stable  Last Vitals:  Vitals Value Taken Time  BP    Temp    Pulse 103 06/16/2017  9:16 AM  Resp 15 06/16/2017  9:16 AM  SpO2 94 % 06/16/2017  9:16 AM  Vitals shown include unvalidated device data.  Last Pain:  Vitals:   06/16/17 0612  TempSrc:   PainSc: 0-No pain         Complications: No apparent anesthesia complications

## 2017-06-16 NOTE — Anesthesia Procedure Notes (Signed)
Spinal  Patient location during procedure: OR Staffing Anesthesiologist: Montez Hageman, MD Performed: anesthesiologist  Preanesthetic Checklist Completed: patient identified, site marked, surgical consent, pre-op evaluation, timeout performed, IV checked, risks and benefits discussed and monitors and equipment checked Spinal Block Patient position: sitting Prep: DuraPrep Patient monitoring: heart rate, continuous pulse ox and blood pressure Approach: midline Location: L3-4 Injection technique: single-shot Needle Needle type: Sprotte  Needle gauge: 24 G Needle length: 15 cm Additional Notes Expiration date of kit checked and confirmed.   Difficult placement. Attempt with regular kit and needle. Switch to CSE kit. LOR EZ and SAB placed. Catheter not threaded.  Patient tolerated procedure well, without complications.

## 2017-06-16 NOTE — H&P (Signed)
[name] [name] is a 37 y.o. female G2 P1001 @ 38 4/7 weeks presenting for routine ob/preop for repeat cesarean section.  Pt with h/o CHTN, controlled currently with Procardia XL 60 mg.  Pt denies contractions, bleeding, active fetus. Pt has had prenatal care with [name] [name] Ob/Gyn [name] [name]). Complicated by CHTN, increased from Procardia XL 30 mg to 60 mg.  Pt has had migraines during pregnancy at the time of increased BP. Pt has had antenatal testing with MFM weekly, BPPs reassuring. Pt with h/o hypothyroidism on Synthroid.  TSH elevated 4 weeks ago.  Pt states she stopped taking Synthroid regularly. OB History    Gravida  2   Para  1   Term  1   Preterm      AB      Living  1     SAB      TAB      Ectopic      Multiple      Live Births  1          Past Medical History:  Diagnosis Date  . Hidradenitis   . Hidradenitis   . Hypertension   . Hypothyroidism   . Migraine   . Morbid obesity (HCC)   . MRSA (methicillin resistant Staphylococcus aureus)   . Narcolepsy   . Sleep apnea   . Urinary tract infection   . Vaginal Pap smear, abnormal   . Vitamin D deficiency    Past Surgical History:  Procedure Laterality Date  . AXILLARY HIDRADENITIS EXCISION    . CARPAL TUNNEL RELEASE    . CESAREAN SECTION    . CHOLECYSTECTOMY    . GANGLION CYST EXCISION    . LAPAROSCOPIC GASTRIC BANDING    . TONSILLECTOMY     Family History: family history includes Breast cancer in her maternal aunt; Diabetes in her father; Hearing loss in her brother; Heart disease in her father; Hypertension in her father and mother; Skin cancer in her mother. Social History:  reports that she has quit smoking. She has never used smokeless tobacco. She reports that she does not drink alcohol or use drugs.     Maternal Diabetes: No Genetic Screening: Normal Maternal Ultrasounds/Referrals: Normal Fetal Ultrasounds or other Referrals:  Referred to Materal Fetal Medicine  Maternal Substance Abuse:   No Significant Maternal Medications:  Meds include: Syntroid Other: Procardia XL 60 mg Significant Maternal Lab Results:  TSH elevated Other Comments:  Morbid obesity, Chronic HTN  Review of Systems  Gastrointestinal: Negative for abdominal pain.  Neurological: Negative for headaches.   Maternal Medical History:  Fetal activity: Perceived fetal activity is normal.    Prenatal complications: PIH.   Hypothyroidism  Prenatal Complications - Diabetes: none.      Last menstrual period 09/18/2016. Maternal Exam:  Abdomen: Estimated fetal weight is 7 lbs 1 oz @ 06/11/2017.    Cervix: not evaluated.   Fetal Exam Fetal Monitor Review: Mode: hand-held doppler probe.   Baseline rate: 150s.      Physical Exam  Constitutional: She is oriented to person, place, and time. She appears well-developed and well-nourished.  HENT:  Head: Normocephalic and atraumatic.  Eyes: EOM are normal.  Cardiovascular: Normal rate and regular rhythm.  Respiratory: Effort normal and breath sounds normal. No respiratory distress.  GI: There is tenderness.  Musculoskeletal: Normal range of motion. She exhibits no tenderness.  Neurological: She is alert and oriented to person, place, and time.  Skin: Skin is warm and dry.  Psychiatric: She has  a normal mood and affect.    Prenatal labs: ABO, Rh: --/--/B POS, B POS Performed at [facility], [address] [place], [place] [address]  [phone](04/22 1150) Antibody: NEG (04/22 1150) Rubella:   RPR: Non Reactive (04/22 1150)  HBsAg:    HIV:    GBS:     Assessment/Plan: IUP @ 38 4/7 weeks H/o previous c-section.  Declines TOLAC.  H/o wound breakdown. CHTN controlled on Procardia XL 30 mg. Hypothyroidism-uncontrolled.  Proceed with repeat c-section.  Pt counseled on R/B/A.  Pt consents to blood transfusion prn. PICO dressing. Check TSH with preop labs.  [name] [name] 06/16/2017, 5:42 AM

## 2017-06-16 NOTE — Anesthesia Preprocedure Evaluation (Signed)
Anesthesia Evaluation  Patient identified by MRN, date of birth, ID band Patient awake    Reviewed: Allergy & Precautions, NPO status , Patient's Chart, lab work & pertinent test results  Airway Mallampati: II  TM Distance: >3 FB Neck ROM: Full    Dental no notable dental hx.    Pulmonary sleep apnea , former smoker,    Pulmonary exam normal breath sounds clear to auscultation       Cardiovascular hypertension, Pt. on medications Normal cardiovascular exam Rhythm:Regular Rate:Normal     Neuro/Psych negative neurological ROS  negative psych ROS   GI/Hepatic negative GI ROS, Neg liver ROS,   Endo/Other  Morbid obesity  Renal/GU negative Renal ROS  negative genitourinary   Musculoskeletal negative musculoskeletal ROS (+)   Abdominal   Peds negative pediatric ROS (+)  Hematology negative hematology ROS (+)   Anesthesia Other Findings   Reproductive/Obstetrics (+) Pregnancy                             Anesthesia Physical Anesthesia Plan  ASA: III  Anesthesia Plan: Spinal   Post-op Pain Management:    Induction:   PONV Risk Score and Plan: 2 and Ondansetron and Treatment may vary due to age or medical condition  Airway Management Planned: Natural Airway  Additional Equipment:   Intra-op Plan:   Post-operative Plan:   Informed Consent: I have reviewed the patients History and Physical, chart, labs and discussed the procedure including the risks, benefits and alternatives for the proposed anesthesia with the patient or authorized representative who has indicated his/her understanding and acceptance.   Dental advisory given  Plan Discussed with:   Anesthesia Plan Comments:         Anesthesia Quick Evaluation

## 2017-06-16 NOTE — Interval H&P Note (Signed)
History and Physical Interval Note:  06/16/2017 6:54 AM  [name] [name]  has presented today for surgery, with the diagnosis of O34.21 Repeat C-section  The various methods of treatment have been discussed with the patient and family. After consideration of risks, benefits and other options for treatment, the patient has consented to  Procedure(s) with comments: REPEAT CESAREAN SECTION (N/A) - EDD 06/25/17 as a surgical intervention .  The patient's history has been reviewed, patient examined, no change in status, stable for surgery.  I have reviewed the patient's chart and labs.  Questions were answered to the patient's satisfaction.     Geryl RankinsEvelyn Thos [name]

## 2017-06-16 NOTE — Lactation Note (Signed)
This note was copied from a baby's chart. Lactation Consultation Note  Patient Name: [name] [name] [id number]'s Date: 06/16/2017 Reason for consult: Initial assessment;Early term [id number]  Visited with P2 Mom of 5 hr old early term baby born by C-Section.  Mom with very large, heavy breasts.  Nipples erect, and areola compressible.  Demonstrated hand expression.  Positioned baby STS prone in laid back position on right breast.  After a couple attempts, baby was able to attain a deep latch.  Swallows identified for Mom. Mom feeling uterine contractions during feeding. Due to large breasts, instructed Mom to press on breast to help prevent baby's nose from being occluded.  Mom knows not to pull breast away from baby's mouth. Baby fed on left side in football hold.  Hand expressed lots of colostrum.  Baby fed for total of 15 mins before self detaching and looking contented.  Mom placed baby STS on her chest. Recommended keeping baby STS, and feeding baby often on cue.   Lactation brochure given and Mom aware of IP and OP lactation support available to her.    Maternal Data Formula Feeding for Exclusion: No Has patient been taught Hand Expression?: Yes Does the patient have breastfeeding experience prior to this delivery?: Yes  Feeding Feeding Type: Breast Fed Length of feed: 15 min  LATCH Score Latch: Grasps breast easily, tongue down, lips flanged, rhythmical sucking.  Audible Swallowing: A few with stimulation  Type of Nipple: Everted at rest and after stimulation  Comfort (Breast/Nipple): Soft / non-tender  Hold (Positioning): Assistance needed to correctly position infant at breast and maintain latch.  LATCH Score: 8  Interventions Interventions: Breast feeding basics reviewed;Assisted with latch;Skin to skin;Breast massage;Hand express;Breast compression;Adjust position;Support pillows;Position options;Expressed milk  Lactation Tools Discussed/Used WIC Program:  No   Consult Status Consult Status: Follow-up Date: 06/17/17 Follow-up type: In-patient    Judee ClaraSmith, Hibba Schram E 06/16/2017, 1:58 PM

## 2017-06-17 ENCOUNTER — Encounter (HOSPITAL_COMMUNITY)
Admission: RE | Admit: 2017-06-17 | Discharge: 2017-06-17 | Disposition: A | Discharge status: Home / Self Care | Payer: Managed Care, Other (non HMO) | Source: Ambulatory Visit | Attending: Obstetrics and Gynecology | Admitting: Obstetrics and Gynecology

## 2017-06-17 ENCOUNTER — Encounter (HOSPITAL_COMMUNITY): Payer: Self-pay | Admitting: Obstetrics and Gynecology

## 2017-06-17 ENCOUNTER — Other Ambulatory Visit: Payer: Self-pay

## 2017-06-17 ENCOUNTER — Encounter (HOSPITAL_COMMUNITY): Payer: Self-pay | Admitting: Certified Registered Nurse Anesthetist

## 2017-06-17 HISTORY — DX: Narcolepsy without cataplexy: G47.419

## 2017-06-17 HISTORY — DX: Sleep apnea, unspecified: G47.30

## 2017-06-17 HISTORY — DX: Vitamin D deficiency, unspecified: E55.9

## 2017-06-17 HISTORY — DX: Hidradenitis suppurativa: L73.2

## 2017-06-17 LAB — COMPREHENSIVE METABOLIC PANEL
ALT: 15 U/L (ref 14–54)
ALT: 16 U/L (ref 14–54)
AST: 29 U/L (ref 15–41)
AST: 25 U/L (ref 15–41)
Albumin: 2.9 g/dL — ABNORMAL LOW (ref 3.5–5.0)
Albumin: 2.7 g/dL — ABNORMAL LOW (ref 3.5–5.0)
Alkaline Phosphatase: 66 U/L (ref 38–126)
Alkaline Phosphatase: 67 U/L (ref 38–126)
Anion gap: 11 (ref 5–15)
Anion gap: 12 (ref 5–15)
BUN: 8 mg/dL (ref 6–20)
BUN: 7 mg/dL (ref 6–20)
CHLORIDE: 102 mmol/L (ref 101–111)
CHLORIDE: 109 mmol/L (ref 101–111)
CO2: 22 mmol/L (ref 22–32)
CO2: 18 mmol/L — AB (ref 22–32)
Calcium: 8.8 mg/dL — ABNORMAL LOW (ref 8.9–10.3)
Calcium: 9 mg/dL (ref 8.9–10.3)
Creatinine, Ser: 0.89 mg/dL (ref 0.44–1.00)
Creatinine, Ser: 0.8 mg/dL (ref 0.44–1.00)
GFR calc Af Amer: >60 mL/min (ref >60)
GFR calc non Af Amer: >60 mL/min (ref >60)
GFR calc non Af Amer: >60 mL/min (ref >60)
GLUCOSE: 122 mg/dL — AB (ref 65–99)
Glucose, Bld: 120 mg/dL — ABNORMAL HIGH (ref 65–99)
POTASSIUM: 3.7 mmol/L (ref 3.5–5.1)
POTASSIUM: 3.7 mmol/L (ref 3.5–5.1)
SODIUM: 135 mmol/L (ref 135–145)
SODIUM: 139 mmol/L (ref 135–145)
Total Bilirubin: 0.5 mg/dL (ref 0.3–1.2)
Total Bilirubin: 0.6 mg/dL (ref 0.3–1.2)
Total Protein: 6.7 g/dL (ref 6.5–8.1)
Total Protein: 6 g/dL — ABNORMAL LOW (ref 6.5–8.1)

## 2017-06-17 LAB — CBC WITH DIFFERENTIAL/PLATELET
BASOS ABS: 0 10*3/uL (ref 0.0–0.1)
BASOS PCT: 0 %
Eosinophils Absolute: 0.1 10*3/uL (ref 0.0–0.7)
Eosinophils Relative: 0 %
HEMATOCRIT: 28.3 % — AB (ref 36.0–46.0)
Hemoglobin: 10.1 g/dL — ABNORMAL LOW (ref 12.0–15.0)
LYMPHS PCT: 17 %
Lymphs Abs: 2.2 10*3/uL (ref 0.7–4.0)
MCH: 26.6 pg (ref 26.0–34.0)
MCHC: 35.7 g/dL (ref 30.0–36.0)
MCV: 74.7 fL — ABNORMAL LOW (ref 78.0–100.0)
MONO ABS: 0.9 10*3/uL (ref 0.1–1.0)
Monocytes Relative: 7 %
Neutro Abs: 9.5 10*3/uL — ABNORMAL HIGH (ref 1.7–7.7)
Neutrophils Relative %: 76 %
Platelets: 174 10*3/uL (ref 150–400)
RBC: 3.79 MIL/uL — AB (ref 3.87–5.11)
RDW: 14.6 % (ref 11.5–15.5)
WBC: 12.6 10*3/uL — AB (ref 4.0–10.5)

## 2017-06-17 LAB — CBC
HCT: 27.4 % — ABNORMAL LOW (ref 36.0–46.0)
HEMOGLOBIN: 9.7 g/dL — AB (ref 12.0–15.0)
MCH: 26.7 pg (ref 26.0–34.0)
MCHC: 35.4 g/dL (ref 30.0–36.0)
MCV: 75.5 fL — ABNORMAL LOW (ref 78.0–100.0)
PLATELETS: 172 10*3/uL (ref 150–400)
RBC: 3.63 MIL/uL — AB (ref 3.87–5.11)
RDW: 14.4 % (ref 11.5–15.5)
WBC: 11.2 10*3/uL — ABNORMAL HIGH (ref 4.0–10.5)

## 2017-06-17 LAB — PROTEIN / CREATININE RATIO, URINE
Creatinine, Urine: 387 mg/dL
PROTEIN CREATININE RATIO: 0.38 mg/mg{creat} — AB (ref 0.00–0.15)
TOTAL PROTEIN, URINE: 148 mg/dL

## 2017-06-17 LAB — URIC ACID: URIC ACID, SERUM: 5 mg/dL (ref 2.3–6.6)

## 2017-06-17 LAB — BIRTH TISSUE RECOVERY COLLECTION (PLACENTA DONATION)

## 2017-06-17 LAB — MAGNESIUM: MAGNESIUM: 3 mg/dL — AB (ref 1.7–2.4)

## 2017-06-17 MED ORDER — MAGNESIUM SULFATE 40 G IN LACTATED RINGERS - SIMPLE
3.0000 g/h | INTRAVENOUS | Status: AC
  Administered 2017-06-17: 2 g/h via INTRAVENOUS
  Administered 2017-06-17: 3 g/h via INTRAVENOUS
  Filled 2017-06-17: qty 40
  Filled 2017-06-17: qty 500

## 2017-06-17 MED ORDER — LACTATED RINGERS IV BOLUS
500.0000 mL | Freq: Once | INTRAVENOUS | Status: AC
  Administered 2017-06-17: 500 mL via INTRAVENOUS

## 2017-06-17 MED ORDER — MAGNESIUM SULFATE BOLUS VIA INFUSION
4.0000 g | Freq: Once | INTRAVENOUS | Status: AC
  Administered 2017-06-17: 4 g via INTRAVENOUS
  Filled 2017-06-17: qty 500

## 2017-06-17 NOTE — Progress Notes (Signed)
Subjective: Postpartum Day 1: Cesarean Delivery Patient reports per RN, no HA, visual changes or abdominal pain.   RN called reporting decreased urine output (about 15cc) around midnight and elevated BPs throughout the day.  Labs ordered and a bolus of LR given as well.  Pt is sleeping upon my arrival this morning.  I did not waken her.  Objective: Vital signs in last 24 hours: Temp:  [97.7 F (36.5 C)-98.6 F (37 C)] 98.2 F (36.8 C) (04/24 0421) Pulse Rate:  [85-110] 100 (04/24 0654) Resp:  [12-25] 18 (04/24 0654) BP: (137-172)/(63-97) 156/89 (04/24 0654) SpO2:  [93 %-98 %] 96 % (04/24 0654)   Recent Labs    06/17/17 0124 06/17/17 0550  HGB 10.1* 9.7*  HCT 28.3* 27.4*   UPCR 0.38 CMET wnl CBC wnl  Assessment/Plan: Status post Cesarean section. Postoperative [facility] with SI preeclampsia  Labs reviewed around 0130 and with elevated PCR, I asked the nurse to notify pt and start Mg with 4g bolus then 2g/hr.  Mg started at 0420. Pt's UOP did not increase after bolus, however after Mg got started UOP per report from night nurse to the day nurse increased. Will hold procardia and recheck labs with Mg level at 1020 Pt signed out to Dr. Elease HashimotoVarnado  [name] [name] 06/17/2017, 7:54 AM

## 2017-06-17 NOTE — Progress Notes (Signed)
[facility] Su Hiltoberts.

## 2017-06-17 NOTE — Lactation Note (Signed)
This note was copied from a baby's chart. Lactation Consultation Note  Patient Name: [name] [name] [id number] Date: 06/17/2017 Reason for consult: Follow-up assessment   Follow up with mom of 31 hour old infant. Infant with 3 BF for 15-25 minutes, 2 BF attempts, 4 voids and 2 stools. LATCH scores 8.   Dad was holding infant and he was asleep. Mom is concerned infant will not latch on this morning. Mom is feeding infant clothed and swaddled. Enc mom to unwrap infant and feed STS, mom voiced understanding.   Mom getting IV redressed and going to the BR. Will need to return at a later time to assist mom with feeding.   Mom asking about pumping, pump kit placed in the room to be set up at a later time.    Maternal Data Formula Feeding for Exclusion: No Has patient been taught Hand Expression?: Yes Does the patient have breastfeeding experience prior to this delivery?: Yes  Feeding    LATCH Score                   Interventions    Lactation Tools Discussed/Used     Consult Status Consult Status: Follow-up Date: 06/17/17 Follow-up type: In-patient    Debby Freiberg Lacreasha Hinds 06/17/2017, 9:21 AM

## 2017-06-17 NOTE — Progress Notes (Signed)
Patient with decreased output. Avg 15-20cc/hr. Dr. Su Hiltoberts contacted. See new orders.

## 2017-06-17 NOTE — Progress Notes (Signed)
Report called to August AlbinoHannah Key, RN on [facility]. Newman PiesWiggins, Bunnie Lederman T, RN

## 2017-06-17 NOTE — Lactation Note (Signed)
This note was copied from a baby's chart. Lactation Consultation Note  Patient Name: [name] [name] ZOXWR'UToday's Date: 06/17/2017 Reason for consult: Follow-up assessment;Mother's request;Early term 37-38.6wks Type of Endocrine Disorder?: Thyroid   Follow up with mom to assist with feeding.   Infant STS with mom and sleepy. Stimulated and spoon fed 3 cc colostrum. Breasts large, soft and fleshy, colostrum very easily compressible.   Assisted mom in latching to the left breast as she is having difficultly latching to the left breast. After several attempts, latched infant to the left breast in the football hold. Infant had difficulty sustaining latch. Relatched infant to the left breast in the cross cradle hold. Infant active and feeding when LC left room. Flanged lip[s, rhythmic suckling and intermittent swallows noted.   Enc mom to spoon feed colostrum first if infant is sleepy and then latch. Mom able to hand express colostrum.   RN to set up pump for mom at mom's request. Mom to call out for feeding assistance as needed.    Maternal Data Formula Feeding for Exclusion: No Has patient been taught Hand Expression?: Yes Does the patient have breastfeeding experience prior to this delivery?: Yes  Feeding Feeding Type: Breast Fed Length of feed: 15 min  LATCH Score Latch: Grasps breast easily, tongue down, lips flanged, rhythmical sucking.  Audible Swallowing: Spontaneous and intermittent  Type of Nipple: Everted at rest and after stimulation  Comfort (Breast/Nipple): Soft / non-tender  Hold (Positioning): Assistance needed to correctly position infant at breast and maintain latch.  LATCH Score: 9  Interventions Interventions: Breast feeding basics reviewed;Support pillows;Assisted with latch;Position options;Skin to skin;Expressed milk;Breast massage;Breast compression;Hand express  Lactation Tools Discussed/Used     Consult Status Consult Status: Follow-up Date:  06/18/17 Follow-up type: In-patient    [name] [name] 06/17/2017, 9:52 AM

## 2017-06-17 NOTE — Progress Notes (Signed)
Subjective: Postop Day 1: Cesarean Delivery C/o swelling of ankles.  Pain controlled.  Lochia normal.  Breast feeding yes. Pt started on Magnesium sulfate for Superimposed Preeclampsia due to decreased UOP and elevated BP.  Pt denies headache or visual changes.  She does describe RUQ pain that started when the PICO dressing was being applied yesterday after c-section.    Objective: Temp:  [97.8 F (36.6 C)-99 F (37.2 C)] 98.4 F (36.9 C) (04/24 1223) Pulse Rate:  [86-109] 98 (04/24 1223) Resp:  [16-20] 18 (04/24 1300) BP: (128-156)/(63-93) 128/83 (04/24 1223) SpO2:  [95 %-100 %] 95 % (04/24 1223)  Physical Exam: Gen: NAD Lochia: Not visualized Uterine Fundus: firm, appropriately tender Incision: PICO dressing well applied and activated.  Slightly soiled. DVT Evaluation: + Edema present, no calf tenderness bilaterally   Recent Labs    06/17/17 0124 06/17/17 0550  HGB 10.1* 9.7*  HCT 28.3* 27.4*   CMP normal.  Magnesium at 9:35 am 3.0 Assessment/Plan: Status post C-section-doing well postoperatively. CHTN with superimposed Preeclampsia.  Diuresis after Magnesium started.  Magnesium subtherapeutic.  Increase to  3 g hour.  Discontinue 24 hours after starting.  F/u on RUQ pain tomorrow.  Start Procardia XL 90 mg, pt's  prepregnancy dose, when Magnesium discontinued. Morbid obesity-Lovenox q am.  PICO dressing in place. Breast feeding.  Lactation support. Encouraged ambulation. Anticipate discharge on POD #3 when BP is controlled. Circumcision will be performed today.  Pt's consented and excepted.    Geryl Rankinsvelyn Keeghan Bialy 06/17/2017, 2:04 PM

## 2017-06-18 MED ORDER — NIFEDIPINE ER OSMOTIC RELEASE 30 MG PO TB24
30.0000 mg | ORAL_TABLET | Freq: Once | ORAL | Status: AC
  Administered 2017-06-18: 30 mg via ORAL
  Filled 2017-06-18: qty 1

## 2017-06-18 MED ORDER — NIFEDIPINE ER OSMOTIC RELEASE 30 MG PO TB24
90.0000 mg | ORAL_TABLET | Freq: Every day | ORAL | Status: DC
  Administered 2017-06-18: 90 mg via ORAL
  Filled 2017-06-18 (×2): qty 3

## 2017-06-18 MED ORDER — NIFEDIPINE ER OSMOTIC RELEASE 30 MG PO TB24
120.0000 mg | ORAL_TABLET | Freq: Every day | ORAL | Status: DC
  Administered 2017-06-19 – 2017-06-20 (×2): 120 mg via ORAL
  Filled 2017-06-18 (×2): qty 4

## 2017-06-18 NOTE — Anesthesia Postprocedure Evaluation (Signed)
Anesthesia Post Note  Patient: Licensed conveyancerhantise Mcgrory  Procedure(s) Performed: REPEAT CESAREAN SECTION (N/A )     Patient location during evaluation: PACU Anesthesia Type: Spinal Level of consciousness: awake and alert Pain management: pain level controlled Vital Signs Assessment: post-procedure vital signs reviewed and stable Respiratory status: spontaneous breathing and respiratory function stable Cardiovascular status: blood pressure returned to baseline and stable Postop Assessment: no headache, no backache, spinal receding and no apparent nausea or vomiting Anesthetic complications: no    Last Vitals:  Vitals:   06/18/17 0412 06/18/17 0813  BP: 125/73 (!) 160/88  Pulse: (!) 103 (!) 111  Resp: 18 20  Temp: 36.8 C 37.2 C  SpO2: 97% 98%    Last Pain:  Vitals:   06/18/17 0813  TempSrc: Oral  PainSc:                  Phillips Groutarignan, Bravlio Luca

## 2017-06-18 NOTE — Lactation Note (Signed)
This note was copied from a baby's chart. Lactation Consultation Note  Patient Name: [name] [name]   Attempted to follow up with mom. Mom in deep sleep. FOB holding infant who is asleep. Will follow up at a later time and at mom's request.   Infant with 6 BF for 10-45 minutes, EBM x 3 of 15-20 cc via bottle, 3 voids and 3 stools in the last 24 hours. infant weight 6 pounds 5.1 ounces with 6% weight loss since birth. LATCH scores 8.      Maternal Data    Feeding Feeding Type: Breast Fed Length of feed: 10 min  LATCH Score                   Interventions    Lactation Tools Discussed/Used     Consult Status      [name] [name] [name], 10:23 AM

## 2017-06-18 NOTE — Progress Notes (Signed)
Subjective: Postop Day 2: Cesarean Delivery Resting comfortably in bed.  Pain controlled.  Lochia normal.  Breast feeding yes. Pt denies headache or visual changes.  +flatus, no BM.  Ambulating and voiding without issues.  Objective: Temp:  [97.8 F (36.6 C)-99 F (37.2 C)] 98.2 F (36.8 C) (04/25 0412) Pulse Rate:  [97-106] 103 (04/25 0412) Resp:  [16-20] 18 (04/25 0412) BP: (125-154)/(73-89) 125/73 (04/25 0412) SpO2:  [95 %-100 %] 97 % (04/25 0412)  Physical Exam: Gen: NAD CV: RRR Lungs: CTAB Abd: obese, soft, non-tender, no rebound, no guarding Uterine Fundus: firm, appropriately tender, below umbiliucs Incision: PICO dressing well applied and activated.  Slightly soiled. DVT Evaluation: + Edema present, no calf tenderness bilaterally  Results for orders placed or performed during the hospital encounter of 06/16/17 (from the past 24 hour(s))  Magnesium     Status: Abnormal   Collection Time: 06/17/17  9:25 AM  Result Value Ref Range   Magnesium 3.0 (H) 1.7 - 2.4 mg/dL  Comprehensive metabolic panel     Status: Abnormal   Collection Time: 06/17/17  9:25 AM  Result Value Ref Range   Sodium 139 135 - 145 mmol/L   Potassium 3.7 3.5 - 5.1 mmol/L   Chloride 109 101 - 111 mmol/L   CO2 18 (L) 22 - 32 mmol/L   Glucose, Bld 122 (H) 65 - 99 mg/dL   BUN 7 6 - 20 mg/dL   Creatinine, Ser 1.610.80 0.44 - 1.00 mg/dL   Calcium 9.0 8.9 - 09.610.3 mg/dL   Total Protein 6.0 (L) 6.5 - 8.1 g/dL   Albumin 2.7 (L) 3.5 - 5.0 g/dL   AST 25 15 - 41 U/L   ALT 16 14 - 54 U/L   Alkaline Phosphatase 67 38 - 126 U/L   Total Bilirubin 0.6 0.3 - 1.2 mg/dL   GFR calc non Af Amer >60 >60 mL/min   GFR calc Af Amer >60 >60 mL/min   Anion gap 12 5 - 15    Assessment/Plan: Status post C-section-doing well postoperatively. POD#2 CHTN with superimposed Preeclampsia.   -sp Magnesium IV  -RUQ pain resolved, pt now asymptomatic  -continue Procardia XL 90 mg Morbid obesity-Lovenox q am.  PICO dressing in  place. Breast feeding.  Lactation support. Encouraged ambulation. Anticipate discharge on POD #3 when BP is controlled.  Continue with postpartum care as outlined above  Myna HidalgoJennifer Maynard David, DO [phone] (cell) [phone] (office)

## 2017-06-18 NOTE — Op Note (Signed)
NAMLindalou [name]: [name], [name] MEDICAL RECORD [id number] ACCOUNT [id number] DATE OF BIRTH:Aug 14, 1980 FACILITY: [facility] LOCATION: [id number] PHYSICIAN:Nyeem Stoke Derrell LollingB. Anise Harbin, MD  OPERATIVE REPORT  DATE OF PROCEDURE:  06/16/2017  PREOPERATIVE DIAGNOSES:   1.  Intrauterine pregnancy at 4038 and 5/7 weeks. 2.  History of previous C-section, desires repeat.   3.  Chronic hypertension.   4.  Morbid obesity.  POSTOPERATIVE DIAGNOSES: 1.  Intrauterine pregnancy at 5938 and 5/7 weeks. 2.  History of previous C-section, desires repeat.   3.  Chronic hypertension.   4.  Morbid obesity.  PROCEDURE PERFORMED:  Repeat low transverse cesarean section with 2-layer closure.  SURGEON:  Geryl RankinsEvelyn Amori Colomb, MD  ASSISTANT:  Dr. Eloise HarmanJenniferOzan  ANESTHESIA:  Spinal.  ESTIMATED BLOOD LOSS:  895  DRAINS:  Foley catheter.    LOCAL:  Lidocaine 10 mL.  SPECIMEN:  None.  DISPOSITION:  Specimen to pathology.    COUNTS CORRECT:  Yes.    PLAN OF CARE:  Admit to inpatient.  PATIENT DISPOSITION:  To PACU hemodynamically stable.  COMPLICATIONS:  None.  FINDINGS:  Viable female infant in the vertex position.  Clear fluid noted.  Dense omental adhesions.  Lower uterine segment appeared normal.  Ovaries and tubes bilaterally normal.  DESCRIPTION OF PROCEDURE:  The patient was identified in the holding area.  She was then taken to the operating room with IV running.  She underwent spinal anesthesia without complication.  She was placed in the dorsal supine position with a leftward  tilt.  She was prepped and draped in a normal sterile fashion.  Generous pannus was taped up prior to prep.  SCDs were on her legs and operating.  She received gentamicin and clindamycin prior to incision.  A timeout was performed.  Her previous incision was marked prior to draping.  A scalpel was used to incise over the old incision, was carried down to the underlying layer of the fascia with the Bovie.  Bovie incised in the midline and extended  laterally.  Adhesions were noted.   The fascia was then grasped with a Kocher clamp and the rectus muscles were dissected sharply off of the fascia.  The rectus muscles were then separated, but there was dense omental fatty layer.  There was a window at the top where the fascia was  dissected down that we then used 2 Kelly clamps.  The Bovie was then used to cut through the omentum and then each pedicle was tied with a free hand tie.  No bleeding was noted.  The lower portion of the abdomen with the rectus muscles were still adhesed  at the midline.  A scalpel was used to go down layer by layer, avoiding any bladder injury.  Once the rectus muscles were separated inferiorly and the omental adhesions were freed, we were able to stretch the abdomen and the adhesions were separated  bluntly.  Uterus was then palpated.  Alexis retractor was then placed and attempted to develop a bladder flap with the Metzenbaum scissors.  The lower uterine segment was then incised in a transverse fashion with the scalpel.  The area was thick, so Allis clamps  were used to displace or to pull the myometrium up to the void because it was so deep.  The Allis clamps were then removed once I reached the amnion.  Bandage scissors were then used to extend the incision.  The Allis clamp was then used to break the bag  and clear fluid was noted.  The baby was vertex and the  head was floating.  The vacuum was then used to deliver the head through the incision easily.  Nose and mouth were suctioned.  There was some delay of the delivery of the anterior shoulder, there  was a compound presentation.  The arm was removed and the shoulder was then delivered.  The baby was then suctioned again at the field.  There was a moderate amount of clear fluid received.  The baby did not spontaneously cry on the field.  Delayed cord  clamping was not performed.  Baby was handed off immediately to the awaiting NICU staff.  Cord blood was obtained.   Placenta was then removed manually.  Pitocin was administered.  The uterus was cleared of all clots and debris with moistened laparotomy  sponge.  After delivery of the baby, ring forceps were used to grasp the angles to minimize bleeding.  Once the uterus was cleaned, the hysterotomy incision was closed with 0 Vicryl in a continuous locked fashion.  A second layer of the same suture was used for imbrication and another interrupted stitch was used for hemostasis.  The gutters were then  irrigated until clear.  Interceed was then applied.  The fascia was then reapproximated with 0 Vicryl with two-thirds, one thirds after 2 sutures on the fascia.  The subcutaneous space was then reapproximated with 2-0 plain gut in 2 layers.  Skin was reapproximated with 4-0 Monocryl in a subcuticular  fashion.  Abdomen was cleaned and PICO dressing was applied.  All instrument, sponge and needle counts were correct x3.  The patient tolerated the procedure well.  AN/NUANCE  D:06/18/2017 T:06/18/2017 JOB:[id number]

## 2017-06-18 NOTE — Lactation Note (Signed)
This note was copied from a baby's chart. Lactation Consultation Note  Patient Name: [name] ZOXWR'UToday's Date: 06/18/2017 Reason for consult: Follow-up assessment;Infant weight loss;Early term 37-38.6wks;Nipple pain/trauma(per mom the left nipple sore )  Baby is 6452 hours old  LC reviewed and updated the doc flow sheets.  Per mom baby recently fed and presently awake and content.  Per mom left nipple sore, LC offered to assess, no break down , and reviewed hand expressing  And had mom apply it to her nipple.  Mom mentioned she has pumped the left due to soreness.  Mother informed of post-discharge support and given phone number to the lactation department, including services for phone call assistance; out-patient appointments; and breastfeeding support group. List of other breastfeeding resources in the community given in the handout. Encouraged mother to call for problems or concerns related to breastfeeding.   Maternal Data Has patient been taught Hand Expression?: Yes(LC reviewed / large drop noted/ enc mom to apply to nipples )  Feeding Feeding Type: (baby last afed at 1100 ) Length of feed: 20 min(per mom )  LATCH Score                   Interventions Interventions: Breast feeding basics reviewed  Lactation Tools Discussed/Used Pump Review: Setup, frequency, and cleaning   Consult Status Consult Status: Follow-up Date: 06/19/17 Follow-up type: In-patient    Matilde SprangMargaret Ann Mailyn Steichen 06/18/2017, 12:26 PM

## 2017-06-19 ENCOUNTER — Encounter (HOSPITAL_COMMUNITY): Payer: Self-pay | Admitting: Anesthesiology

## 2017-06-19 MED ORDER — HYDROCHLOROTHIAZIDE 25 MG PO TABS
25.0000 mg | ORAL_TABLET | Freq: Every day | ORAL | Status: DC
  Administered 2017-06-19 – 2017-06-20 (×2): 25 mg via ORAL
  Filled 2017-06-19 (×2): qty 1

## 2017-06-19 NOTE — Progress Notes (Signed)
Subjective: Postpartum Day 3: Cesarean Delivery Patient reports tolerating PO and no problems voiding. She denies headache / visual disturbances or ruq pain.   Objective: Vital signs in last 24 hours: Temp:  [98.2 F (36.8 C)-99 F (37.2 C)] 98.4 F (36.9 C) (04/26 1604) Pulse Rate:  [94-117] 106 (04/26 1823) Resp:  [17-20] 20 (04/26 1604) BP: (136-177)/(67-104) 177/102 (04/26 1823) SpO2:  [94 %-100 %] 94 % (04/26 1135)  Physical Exam:  General: alert, cooperative and no distress Lochia: appropriate Uterine Fundus: firm Incision: PICO in place and functioning  DVT Evaluation: No evidence of DVT seen on physical exam. EXT 2 + Edema   Recent Labs    06/17/17 0124 06/17/17 0550  HGB 10.1* 9.7*  HCT 28.3* 27.4*    Assessment/Plan: Status post Cesarean section. Postoperative course complicated by chronic hypertension with super imposed preeclampsia   CHTN with superimposed Preeclampsia.              -sp Magnesium IV             -RUQ pain resolved, pt now asymptomatic. She denies neurologic symptoms              -continue Procardia XL 120 mg  -continue HCTZ 25 mg added this afternoon  Morbid obesity-Lovenox q am.  PICO dressing in place. Breast feeding.  Lactation support. Encouraged ambulation. Disposition. Will monitor bp overnight given severe range pressures this afternoon. Possible discharge home in am      Ben Habermann J. 06/19/2017, 6:28 PM

## 2017-06-19 NOTE — Lactation Note (Signed)
This note was copied from a baby's chart. Lactation Consultation Note  Patient Name: [name] Lindalou HoseShantise Barbee WJXBJ'YToday's Date: 06/19/2017 Reason for consult: Follow-up assessment;Infant weight loss;Early term 37-38.6wks;1st time breastfeeding;Primapara;Other (Comment)(6% weight loss/ )  Baby is 3579 hours old, milk is in.  [facility] / [facility] for [facility]/C if baby goes home today with mom  Per mom her B/P meds are being adjusted, and the decision to be D/C will be made 5 pm. Per mom left nipple still pinching when latching, so she has been pumping.  LC assessed with moms permission, no breakdown noted, just areola edema. LC recommended after massage,hand express, pre-pump, and reverse pressure.  Also have dad assist to ease baby's chin down with latch, until comfort achieved.  Per mom milk is in and has been able to pump off 5 oz ( fed 3 oz / baby took it well  And a hour later he took 2 oz and baby ended up spitting up ). LC asked RN to check  For specific times for feedings of EBM from bottle.  LC reminded mom the baby is 3579 hours old and that is a a large volume for the baby to take in at one  Sitting at this age. Baby's at this age would take a range of 45 ml . Especially if the baby fed at the breast 1st  It probably would be less volume due to her milk being in.   and since has pumped off 3 oz ( in the refrigerator, and LC confirmed.)  Sore nipple and engorgement prevention and tx reviewed.  Per mom has DEBP at home, ( Medela ).  Mother informed of post-discharge support and given phone number to the lactation department, including services for phone call assistance; out-patient appointments; and breastfeeding support group. List of other breastfeeding resources in the community given in the handout. Encouraged mother to call for problems or concerns related to breastfeeding.     Maternal Data    Feeding Feeding Type: (per baby last fed at 240p, ) Length of feed: 20 min  LATCH  Score Latch: (mother did not call RN to assess LATCH, asked her)                 Interventions Interventions: Breast feeding basics reviewed;DEBP  Lactation Tools Discussed/Used     Consult Status Consult Status: Complete Date: 06/19/17    [name] [name] 06/19/2017, 3:56 PM

## 2017-06-19 NOTE — Progress Notes (Signed)
Gave 20mg  labetalol IV for severe range BPs.  15 minute follow up BP also severe range.  When attempted to give 40 mg labetalol IV, IV site had infiltrated.  Working on getting a new IV site and will give 40 mg dose when new site secured.

## 2017-06-20 MED ORDER — OXYCODONE HCL 5 MG PO TABS: 5.0000 mg | ORAL_TABLET | ORAL | 0 refills | Status: AC | PRN

## 2017-06-20 MED ORDER — IBUPROFEN 600 MG PO TABS: 600.0000 mg | ORAL_TABLET | Freq: Four times a day (QID) | ORAL | 1 refills | Status: DC | PRN

## 2017-06-20 MED ORDER — HYDROCHLOROTHIAZIDE 25 MG PO TABS: 25.0000 mg | ORAL_TABLET | Freq: Every day | ORAL | 0 refills | Status: AC

## 2017-06-20 MED ORDER — NIFEDIPINE ER 60 MG PO TB24: 120.0000 mg | ORAL_TABLET | Freq: Every day | ORAL | 1 refills | Status: DC

## 2017-06-20 NOTE — Progress Notes (Signed)
Subjective: Postpartum Day 4: Cesarean Delivery Patient reports tolerating PO, + flatus and no problems voiding.  Incision pain  Objective: Vital signs in last 24 hours: Temp:  [98 F (36.7 C)-99.5 F (37.5 C)] 99.5 F (37.5 C) (04/27 0820) Pulse Rate:  [90-106] 92 (04/27 0820) Resp:  [18-22] 20 (04/27 0820) BP: (138-177)/(77-104) 152/79 (04/27 0820) SpO2:  [94 %-99 %] 94 % (04/27 0820)  Physical Exam:  General: alert, cooperative and no distress Lochia: appropriate Uterine Fundus: firm Incision: bandage is dirty. Stained with old blood no active bleeding noted.  DVT Evaluation: No evidence of DVT seen on physical exam.  No results for input(s): HGB, HCT in the last 72 hours.  Assessment/Plan: Status post Cesarean section. Postoperative course complicated by chronic hypertension with superimposed preeclampsia   CHTN with superimposed Preeclampsia.  -bp under better control -sp Magnesium IV -RUQ pain resolved, pt now asymptomatic.  She denies neurologic symptoms  -continue Procardia XL 120 mg             -continue HCTZ 25 mg   Morbid obesity-Lovenox q am while inpatient   Breast feeding. Lactation support. Encouraged ambulation. Disposition.  D/C home today pt to follow up for bp check on Monday with Dr. Dion Body.     [name] [name] J. 06/20/2017, 8:38 AM

## 2017-06-20 NOTE — Progress Notes (Signed)
Discharge instructions reviewed with patient.  Patient states understanding of home care for self and baby, medications, activity, signs/symptoms to report to MD and return MD office visits for both.  Patients significant other and family will assist with her care @ home.  No home  equipment needed, patient has prescriptions and all personal belongings.  Patient ambulated for discharge in stable condition with staff without incident.  Baby discharged home with parents.

## 2017-06-20 NOTE — Lactation Note (Signed)
This note was copied from a baby's chart. Lactation Consultation Note  Mother reports that she is going home today. Mother is breastfeeding infant ,pumping and bottle feeding infant.  Mother has approx 50 ml of ebm in bottle at the bedside.  Mother reports that she has an electric pump at home. She was also given a harmony hand pump with instructions to use.  Advised mother to continue to breast infant on cue and at least 8-12 times in 24 hours.  Mother advised to do  Frequent skin to skin.  Discussed severe engorgement prevention and treatment. Mother is receptive to all teaching.  Mother reminded mother of all LC services and community support.   Patient Name: [name] ZOXWR'U Date: 06/20/2017 Reason for consult: Follow-up assessment   Maternal Data    Feeding Feeding Type: Bottle Fed - Breast Milk Nipple Type: Slow - flow  LATCH Score                   Interventions    Lactation Tools Discussed/Used     Consult Status Consult Status: Complete    Michel Bickers 06/20/2017, 11:21 AM

## 2017-06-20 NOTE — Discharge Summary (Signed)
OB Discharge Summary     Patient Name: [name] [name] DOB: December 07, 1980 MRN: [id number]  Date of admission: 06/16/2017 Delivering MD: Geryl Rankins   Date of discharge: 06/20/2017  Admitting diagnosis: O34.21 Repeat C-section Intrauterine pregnancy: [redacted]w[redacted]d     Secondary diagnosis:  Active Problems:   S/P repeat low transverse C-section  Additional problems: chronic hypertension with superimposed preeclampsia      Discharge diagnosis: Term Pregnancy Delivered and CHTN with superimposed preeclampsia                                                                                                Post partum procedures:None  Augmentation: NA  Complications: None  Hospital course:  Sceduled C/S   37 y.o. yo G2P2002 at [redacted]w[redacted]d was admitted to the hospital 06/16/2017 for scheduled cesarean section with the following indication:Elective Repeat.  Membrane Rupture Time/Date: 8:01 AM ,06/16/2017   Patient delivered a Viable infant.06/16/2017  Details of operation can be found in separate operative note.  Pateint had an uncomplicated postpartum course.  She is ambulating, tolerating a regular diet, passing flatus, and urinating well. Patient is discharged home in stable condition on  06/20/17         Physical exam  Vitals:   06/19/17 2048 06/19/17 2324 06/20/17 0431 06/20/17 0820  BP: (!) 142/88 (!) 141/85 138/83 (!) 152/79  Pulse: 94 (!) 103 94 92  Resp:  (!) 21 (!) 22 20  Temp:  98 F (36.7 C) 98.4 F (36.9 C) 99.5 F (37.5 C)  TempSrc:   Oral Oral  SpO2:  95% 99% 94%  Weight:      Height:       General: alert, cooperative and no distress Lochia: appropriate Uterine Fundus: firm Incision: dressing soiled.. Plan to replace prior to discharge  DVT Evaluation: No evidence of DVT seen on physical exam. Labs: Lab Results  Component Value Date   WBC 11.2 (H) 06/17/2017   HGB 9.7 (L) 06/17/2017   HCT 27.4 (L) 06/17/2017   MCV 75.5 (L) 06/17/2017   PLT 172 06/17/2017   CMP Latest  Ref Rng & Units 06/17/2017  Glucose 65 - 99 mg/dL 952(W)  BUN 6 - 20 mg/dL 7  Creatinine 4.13 - 2.44 mg/dL 0.10  Sodium 272 - 536 mmol/L 139  Potassium 3.5 - 5.1 mmol/L 3.7  Chloride 101 - 111 mmol/L 109  CO2 22 - 32 mmol/L 18(L)  Calcium 8.9 - 10.3 mg/dL 9.0  Total Protein 6.5 - 8.1 g/dL 6.0(L)  Total Bilirubin 0.3 - 1.2 mg/dL 0.6  Alkaline Phos 38 - 126 U/L 67  AST 15 - 41 U/L 25  ALT 14 - 54 U/L 16    Discharge instruction: per After Visit Summary and "Baby and Me Booklet".  After visit meds:  Allergies as of 06/20/2017      Reactions   Fish Allergy Anaphylaxis   Escitalopram Oxalate Hives   Penicillins Hives, Swelling, Other (See Comments)   Has patient had a PCN reaction causing immediate rash, facial/tongue/throat swelling, SOB or lightheadedness with hypotension: Unknown Has patient had a PCN reaction causing severe  rash involving mucus membranes or skin necrosis: Unknown Has patient had a PCN reaction that required hospitalization: Unknown Has patient had a PCN reaction occurring within the last 10 years: No If all of the above answers are "NO", then may proceed with Cephalosporin use.   Pineapple Other (See Comments)   Rash and tongue swells      Medication List    STOP taking these medications   loperamide 2 MG tablet Commonly known as:  IMODIUM A-D     TAKE these medications   butalbital-acetaminophen-caffeine 50-325-40 MG tablet Commonly known as:  FIORICET, ESGIC Take 1-2 tablets by mouth every 6 (six) hours as needed for headache.   EPIPEN 2-PAK 0.3 mg/0.3 mL Soaj injection Generic drug:  EPINEPHrine Inject 0.3 mg into the muscle once.   hydrochlorothiazide 25 MG tablet Commonly known as:  HYDRODIURIL Take 1 tablet (25 mg total) by mouth daily.   ibuprofen 600 MG tablet Commonly known as:  ADVIL,MOTRIN Take 1 tablet (600 mg total) by mouth every 6 (six) hours as needed.   levothyroxine 200 MCG tablet Commonly known as:  SYNTHROID, LEVOTHROID Take  200 mcg by mouth daily before breakfast.   NIFEdipine 60 MG 24 hr tablet Commonly known as:  PROCARDIA-XL/ADALAT CC Take 2 tablets (120 mg total) by mouth daily. What changed:  how much to take   oxyCODONE 5 MG immediate release tablet Commonly known as:  Oxy IR/ROXICODONE Take 1 tablet (5 mg total) by mouth every 4 (four) hours as needed for up to 7 days (pain scale 4-7).   PRENATAL VITAMIN PO Take 3 tablets by mouth daily.       Diet: low salt diet  Activity: Advance as tolerated. Pelvic rest for 6 weeks.   Outpatient follow up:2 days for bp check  Follow up Appt:No future appointments. Follow up Visit:No follow-ups on file.  Postpartum contraception: Not Discussed  Newborn Data: Live born female  Birth Weight: 6 lb 11.6 oz (3050 g) APGAR: 3, 8  Newborn Delivery   Birth date/time:  06/16/2017 08:02:00 Delivery type:  C-Section, Vacuum Assisted Trial of labor:  No C-section categorization:  Repeat     Baby Feeding: Breast Disposition:home with mother   06/20/2017 Jessee Avers., MD

## 2017-06-28 ENCOUNTER — Inpatient Hospital Stay (HOSPITAL_COMMUNITY): Payer: Managed Care, Other (non HMO)

## 2017-06-28 ENCOUNTER — Encounter (HOSPITAL_COMMUNITY): Payer: Self-pay | Admitting: *Deleted

## 2017-06-28 ENCOUNTER — Inpatient Hospital Stay (HOSPITAL_COMMUNITY)
Admission: AD | Admit: 2017-06-28 | Discharge: 2017-06-28 | Disposition: A | Discharge status: Home / Self Care | Payer: Managed Care, Other (non HMO) | Source: Ambulatory Visit | Attending: Obstetrics and Gynecology | Admitting: Obstetrics and Gynecology

## 2017-06-28 DIAGNOSIS — O1003 Pre-existing essential hypertension complicating the puerperium: Secondary | ICD-10-CM | POA: Insufficient documentation

## 2017-06-28 DIAGNOSIS — T8130XA Disruption of wound, unspecified, initial encounter: Secondary | ICD-10-CM

## 2017-06-28 DIAGNOSIS — R109 Unspecified abdominal pain: Secondary | ICD-10-CM | POA: Diagnosis not present

## 2017-06-28 DIAGNOSIS — O9089 Other complications of the puerperium, not elsewhere classified: Secondary | ICD-10-CM | POA: Insufficient documentation

## 2017-06-28 DIAGNOSIS — O902 Hematoma of obstetric wound: Secondary | ICD-10-CM | POA: Insufficient documentation

## 2017-06-28 DIAGNOSIS — T8131XA Disruption of external operation (surgical) wound, not elsewhere classified, initial encounter: Secondary | ICD-10-CM

## 2017-06-28 DIAGNOSIS — G8918 Other acute postprocedural pain: Secondary | ICD-10-CM

## 2017-06-28 LAB — CBC
HCT: 32.2 % — ABNORMAL LOW (ref 36.0–46.0)
Hemoglobin: 11.3 g/dL — ABNORMAL LOW (ref 12.0–15.0)
MCH: 26.6 pg (ref 26.0–34.0)
MCHC: 35.1 g/dL (ref 30.0–36.0)
MCV: 75.8 fL — ABNORMAL LOW (ref 78.0–100.0)
Platelets: 276 10*3/uL (ref 150–400)
RBC: 4.25 MIL/uL (ref 3.87–5.11)
RDW: 14.2 % (ref 11.5–15.5)
WBC: 10.8 10*3/uL — ABNORMAL HIGH (ref 4.0–10.5)

## 2017-06-28 LAB — COMPREHENSIVE METABOLIC PANEL
ALT: 18 U/L (ref 14–54)
AST: 22 U/L (ref 15–41)
Albumin: 3.7 g/dL (ref 3.5–5.0)
Alkaline Phosphatase: 55 U/L (ref 38–126)
Anion gap: 12 (ref 5–15)
BUN: 15 mg/dL (ref 6–20)
CO2: 25 mmol/L (ref 22–32)
Calcium: 8.9 mg/dL (ref 8.9–10.3)
Chloride: 102 mmol/L (ref 101–111)
Creatinine, Ser: 0.91 mg/dL (ref 0.44–1.00)
GFR calc Af Amer: >60 mL/min (ref >60)
GFR calc non Af Amer: >60 mL/min (ref >60)
Glucose, Bld: 92 mg/dL (ref 65–99)
Potassium: 3.3 mmol/L — ABNORMAL LOW (ref 3.5–5.1)
Sodium: 139 mmol/L (ref 135–145)
Total Bilirubin: 0.7 mg/dL (ref 0.3–1.2)
Total Protein: 8 g/dL (ref 6.5–8.1)

## 2017-06-28 LAB — PROTEIN / CREATININE RATIO, URINE
Creatinine, Urine: 449 mg/dL
Protein Creatinine Ratio: 0.1 mg/mg{Cre} (ref 0.00–0.15)
Total Protein, Urine: 46 mg/dL

## 2017-06-28 MED ORDER — HYDROMORPHONE HCL 2 MG PO TABS: 2.0000 mg | ORAL_TABLET | ORAL | 0 refills | Status: DC | PRN

## 2017-06-28 MED ORDER — POTASSIUM CHLORIDE ER 10 MEQ PO TBCR: 10.0000 meq | EXTENDED_RELEASE_TABLET | Freq: Every day | ORAL | 0 refills | Status: DC

## 2017-06-28 MED ORDER — DIPHENHYDRAMINE HCL 50 MG/ML IJ SOLN
25.0000 mg | Freq: Once | INTRAMUSCULAR | Status: AC
  Administered 2017-06-28: 25 mg via INTRAVENOUS
  Filled 2017-06-28: qty 1

## 2017-06-28 MED ORDER — OXYCODONE-ACETAMINOPHEN 5-325 MG PO TABS
2.0000 | ORAL_TABLET | Freq: Once | ORAL | Status: AC
  Administered 2017-06-28: 2 via ORAL
  Filled 2017-06-28: qty 2

## 2017-06-28 MED ORDER — DIPHENHYDRAMINE HCL 25 MG PO CAPS
25.0000 mg | ORAL_CAPSULE | Freq: Once | ORAL | Status: AC
  Administered 2017-06-28: 25 mg via ORAL
  Filled 2017-06-28: qty 1

## 2017-06-28 MED ORDER — CLINDAMYCIN HCL 150 MG PO CAPS: 300.0000 mg | ORAL_CAPSULE | Freq: Four times a day (QID) | ORAL | 0 refills | Status: AC

## 2017-06-28 MED ORDER — PRENATAL VITAMIN 27-0.8 MG PO TABS: 1.0000 | ORAL_TABLET | Freq: Every day | ORAL | 0 refills | Status: DC

## 2017-06-28 MED ORDER — HYDROMORPHONE HCL 1 MG/ML IJ SOLN
2.0000 mg | Freq: Once | INTRAMUSCULAR | Status: AC
  Administered 2017-06-28: 2 mg via INTRAVENOUS
  Filled 2017-06-28: qty 2

## 2017-06-28 NOTE — Discharge Instructions (Signed)
Wound Dehiscence °Wound dehiscence is when a cut from surgery (an incision) opens up and does not heal like it should. This problem usually happens 7-10 days after surgery. You may have bleeding from the cut. You may also have pain or a fever. This condition should be treated early. °Follow these instructions at home: °Medicines °· Take over-the-counter and prescription medicines only as told by your doctor. °· If you were prescribed an antibiotic medicine, take it as told by your doctor. Do not stop taking it even if you start to feel better. °· Use medicines that help to stop itching as told by your doctor. The wound may feel itchy as it heals. °Wound care °· Follow instructions from your doctor about how to take care of your wound. Make sure you: °? Wash your hands with soap and water before you change your bandage (dressing) or wash the wound area. If you cannot use soap and water, use hand sanitizer. °? Wash your wound with mild soap and water 2 times a day, or as told. Rinse off the soap. Pat the area dry with a clean towel. Do not rub the wound. °? Change bandages as told by your doctor. °· Do not pick or scratch at the wound. °· Check your wound area every day for signs of infection. Check for: °? More redness, swelling, or pain. °? More fluid or blood. °? Warmth. °? Pus or a bad smell. °Activity °· Avoid exercises that make you sweat or could stretch your wound. °· Do not lift anything that is heavier than 10 lb (4.5 kg) until the wound is healed or until your doctor says that it is safe. °General instructions °· Do not take baths, swim, or use a hot tub until your doctor approves. You may take showers. °· Keep all follow-up visits as told by your doctor. This is important. °Contact a doctor if: °· Your wound does not seem to be healing right. °· You have a fever. °Get help right away if: °· You have more redness, swelling, or pain around your wound. °· You have more fluid coming from your wound. °· Your  wound feels warm to the touch. °· You have pus or a bad smell coming from your wound. °· More of the wound breaks open. °· You have red streaks spreading from your wound. °· You have a lot of bleeding from your wound. °Summary °· Wound dehiscence is when a cut from surgery (an incision) opens up and does not heal like it should. °· Follow instructions from your doctor about how to take care of your wound. °· Check your wound area every day for signs of infection. These signs can be redness, swelling, pain, fluid, blood, warmth, pus, or a bad smell. °· If you were prescribed an antibiotic medicine, take it as told by your doctor. Do not stop taking it even if you start to feel better. °· Contact a doctor if your wound does not seem to be healing right. °This information is not intended to replace advice given to you by your health care provider. Make sure you discuss any questions you have with your health care provider. °Document Released: 01/29/2009 Document Revised: 01/16/2016 Document Reviewed: 01/16/2016 °Elsevier Interactive Patient Education © 2017 [employer] ° °

## 2017-06-28 NOTE — MAU Note (Addendum)
Lab has stuck pt twice and RN x 1 without success to obtain lab work. U/S ready for pt. Will send pt to u/s and then work on labs when pt returns

## 2017-06-28 NOTE — Progress Notes (Signed)
Lori Clemmons CNM notified of pt's request for something for itching. Ordered received

## 2017-06-28 NOTE — Progress Notes (Signed)
U/s results and urine PCR reported to [facility] CNM. Will pursue obtaining labs and may have to call anesthesia

## 2017-06-28 NOTE — MAU Note (Signed)
Has been having c/s incision packed for couple days. Tonight pain is worse R side of incision with firm area felt on R side. Some pain shooting up from R side of incision. No vag bleeding

## 2017-06-28 NOTE — MAU Note (Signed)
WHen pt returned from u/s [name] [name] CNM made aware of inability to obtain labs. Aware PCR is in process and awaiting u/s results. No new orders. Pt may drink flds

## 2017-06-28 NOTE — Progress Notes (Signed)
Lori Clemmons CNM notified of pt's admission and status. Will see pt

## 2017-06-28 NOTE — MAU Provider Note (Signed)
History     CSN: [id number]  Arrival date and time: 06/28/17 7829   First Provider Initiated Contact with Patient 06/28/17 [phone] .  Called to see the pt because she woke up with more pain.      Chief Complaint  Patient presents with  . Post-op Problem   HPI    Past Medical History:  Diagnosis Date  . Hidradenitis   . Hidradenitis   . Hypertension   . Hypothyroidism   . Migraine   . Morbid obesity (HCC)   . MRSA (methicillin resistant Staphylococcus aureus)   . Narcolepsy   . Sleep apnea   . Urinary tract infection   . Vaginal Pap smear, abnormal   . Vitamin D deficiency     Past Surgical History:  Procedure Laterality Date  . AXILLARY HIDRADENITIS EXCISION    . CARPAL TUNNEL RELEASE    . CESAREAN SECTION    . CESAREAN SECTION N/A 06/16/2017   Procedure: REPEAT CESAREAN SECTION;  Surgeon: Geryl Rankins, MD;  Location: [facility] BIRTHING SUITES;  Service: Obstetrics;  Laterality: N/A;  EDD 06/25/17  . CHOLECYSTECTOMY    . GANGLION CYST EXCISION    . LAPAROSCOPIC GASTRIC BANDING    . TONSILLECTOMY      Family History  Problem Relation Age of Onset  . Hypertension Mother   . Skin cancer Mother   . Diabetes Father   . Hypertension Father   . Heart disease Father   . Breast cancer Maternal Aunt   . Hearing loss Brother     Social History   Tobacco Use  . Smoking status: Former Games developer  . Smokeless tobacco: Never Used  Substance Use Topics  . Alcohol use: No  . Drug use: No    Allergies:  Allergies  Allergen Reactions  . Fish Allergy Anaphylaxis  . Escitalopram Oxalate Hives  . Penicillins Hives, Swelling and Other (See Comments)    Has patient had a PCN reaction causing immediate rash, facial/tongue/throat swelling, SOB or lightheadedness with hypotension: Unknown Has patient had a PCN reaction causing severe rash involving mucus membranes or skin necrosis: Unknown Has patient had a PCN reaction that required hospitalization: Unknown Has patient had a PCN  reaction occurring within the last 10 years: No If all of the above answers are "NO", then may proceed with Cephalosporin use.   Marland Kitchen Pineapple Other (See Comments)    Rash and tongue swells    Medications Prior to Admission  Medication Sig Dispense Refill Last Dose  . butalbital-acetaminophen-caffeine (FIORICET, ESGIC) 50-325-40 MG tablet Take 1-2 tablets by mouth every 6 (six) hours as needed for headache. 10 tablet 0 Past Month at Unknown time  . hydrochlorothiazide (HYDRODIURIL) 25 MG tablet Take 1 tablet (25 mg total) by mouth daily. 30 tablet 0 06/27/2017 at Unknown time  . ibuprofen (ADVIL,MOTRIN) 600 MG tablet Take 1 tablet (600 mg total) by mouth every 6 (six) hours as needed. 30 tablet 1 06/27/2017 at Unknown time  . levothyroxine (SYNTHROID, LEVOTHROID) 200 MCG tablet Take 200 mcg by mouth daily before breakfast.    06/27/2017 at Unknown time  . NIFEdipine (PROCARDIA-XL/ADALAT CC) 60 MG 24 hr tablet Take 2 tablets (120 mg total) by mouth daily. 60 tablet 1 06/27/2017 at Unknown time  . Prenatal Vit-Fe Fumarate-FA (PRENATAL VITAMIN PO) Take 3 tablets by mouth daily.    06/27/2017 at Unknown time  . EPINEPHrine (EPIPEN 2-PAK) 0.3 mg/0.3 mL IJ SOAJ injection Inject 0.3 mg into the muscle once.   Unknown at  Unknown time    Review of Systems Physical Exam   Blood pressure 108/72, pulse 88, temperature 98.3 F (36.8 C), resp. rate 18, SpO2 99 %, currently breastfeeding.  Physical Exam  abd obese.  Superficial wound noted. Incision probed with sterile q tip and fascia intact  MAU Course  Procedures  MDM   Assessment and Plan  Abdominal pain with superficial wound seromas\ Pt afebrile with fascia intact Wet to dry  Dressing done Will give pain meds and fu with DR Gunnar Bulla tomorrow Keflex started.    Azalie Harbeck A 06/28/2017, 9:32 AM

## 2017-06-28 NOTE — MAU Provider Note (Addendum)
History     CSN: [id number]  Arrival date and time: 06/28/17 0115   First Provider Initiated Contact with Patient 06/28/17 [phone]      Chief Complaint  Patient presents with  . Post-op Problem   rn note- Has been having c/s incision packed for couple days. Tonight pain is worse R side of incision with firm area felt on R side. Some pain shooting up from R side of incision. No vag bleeding  Pt states she went to [facility] for arm and head pain while coincidently they removed her PICO dressing and found her incision open. They packed her incision and her husband packed her incision yesterday. She also has cHTN with superimposed preeclampsia. She is 8 days post op delivery from repeat C/S      Past Medical History:  Diagnosis Date  . Hidradenitis   . Hidradenitis   . Hypertension   . Hypothyroidism   . Migraine   . Morbid obesity (HCC)   . MRSA (methicillin resistant Staphylococcus aureus)   . Narcolepsy   . Sleep apnea   . Urinary tract infection   . Vaginal Pap smear, abnormal   . Vitamin D deficiency     Past Surgical History:  Procedure Laterality Date  . AXILLARY HIDRADENITIS EXCISION    . CARPAL TUNNEL RELEASE    . CESAREAN SECTION    . CESAREAN SECTION N/A 06/16/2017   Procedure: REPEAT CESAREAN SECTION;  Surgeon: Geryl Rankins, MD;  Location: [facility] BIRTHING SUITES;  Service: Obstetrics;  Laterality: N/A;  EDD 06/25/17  . CHOLECYSTECTOMY    . GANGLION CYST EXCISION    . LAPAROSCOPIC GASTRIC BANDING    . TONSILLECTOMY      Family History  Problem Relation Age of Onset  . Hypertension Mother   . Skin cancer Mother   . Diabetes Father   . Hypertension Father   . Heart disease Father   . Breast cancer Maternal Aunt   . Hearing loss Brother     Social History   Tobacco Use  . Smoking status: Former Games developer  . Smokeless tobacco: Never Used  Substance Use Topics  . Alcohol use: No  . Drug use: No    Allergies:  Allergies  Allergen Reactions  .  Fish Allergy Anaphylaxis  . Escitalopram Oxalate Hives  . Penicillins Hives, Swelling and Other (See Comments)    Has patient had a PCN reaction causing immediate rash, facial/tongue/throat swelling, SOB or lightheadedness with hypotension: Unknown Has patient had a PCN reaction causing severe rash involving mucus membranes or skin necrosis: Unknown Has patient had a PCN reaction that required hospitalization: Unknown Has patient had a PCN reaction occurring within the last 10 years: No If all of the above answers are "NO", then may proceed with Cephalosporin use.   Marland Kitchen Pineapple Other (See Comments)    Rash and tongue swells    Medications Prior to Admission  Medication Sig Dispense Refill Last Dose  . butalbital-acetaminophen-caffeine (FIORICET, ESGIC) 50-325-40 MG tablet Take 1-2 tablets by mouth every 6 (six) hours as needed for headache. 10 tablet 0 Past Month at Unknown time  . hydrochlorothiazide (HYDRODIURIL) 25 MG tablet Take 1 tablet (25 mg total) by mouth daily. 30 tablet 0 06/27/2017 at Unknown time  . ibuprofen (ADVIL,MOTRIN) 600 MG tablet Take 1 tablet (600 mg total) by mouth every 6 (six) hours as needed. 30 tablet 1 06/27/2017 at Unknown time  . levothyroxine (SYNTHROID, LEVOTHROID) 200 MCG tablet Take 200 mcg by mouth daily  before breakfast.    06/27/2017 at Unknown time  . NIFEdipine (PROCARDIA-XL/ADALAT CC) 60 MG 24 hr tablet Take 2 tablets (120 mg total) by mouth daily. 60 tablet 1 06/27/2017 at Unknown time  . Prenatal Vit-Fe Fumarate-FA (PRENATAL VITAMIN PO) Take 3 tablets by mouth daily.    06/27/2017 at Unknown time  . EPINEPHrine (EPIPEN 2-PAK) 0.3 mg/0.3 mL IJ SOAJ injection Inject 0.3 mg into the muscle once.   Unknown at Unknown time    Review of Systems  Gastrointestinal: Positive for abdominal pain.  All other systems reviewed and are negative.  Physical Exam   Blood pressure 137/80, pulse (!) 107, temperature 98.1 F (36.7 C), resp. rate 18, SpO2 99 %, currently  breastfeeding.  Physical Exam  Nursing note and vitals reviewed. Constitutional: She is oriented to person, place, and time. She appears well-developed and well-nourished.  HENT:  Head: Normocephalic.  Neck: Normal range of motion.  Cardiovascular: Normal rate and regular rhythm.  Respiratory: Effort normal. No respiratory distress.  GI: Soft. There is tenderness.  Musculoskeletal: Normal range of motion.  Neurological: She is alert and oriented to person, place, and time.  Skin: Skin is warm and dry.  Psychiatric: She has a normal mood and affect. Her behavior is normal.   US Abdomen Limited  Result Date: 06/28/2017 CLINICAL DATA:  37 year old female status post recent C-section presenting with right-sided anterior abdominal wall pain. EXAM: ULTRASOUND ABDOMEN LIMITED COMPARISON:  None. FINDINGS: There are multiple complex collections in the soft tissues of the anterior abdominal wall with lace-like internal architecture measuring up to 5.1 x 6.4 x 3.2 cm. These appear to communicate with each other and likely represent postoperative seroma. An infectious process is not entirely excluded. Clinical correlation is recommended. No significant hyperemia noted on the color images in the surrounding soft tissue. IMPRESSION: Complex fluid collections in the anterior abdominal wall, likely seroma. Clinical correlation is recommended to exclude superimposed infection. Electronically Signed   By: Elgie Collard M.D.   On: 06/28/2017 04:33   Results for orders placed or performed during the hospital encounter of 06/28/17 (from the past 24 hour(s))  Protein / creatinine ratio, urine     Status: None   Collection Time: 06/28/17  3:45 AM  Result Value Ref Range   Creatinine, Urine 449 mg/dL   Total Protein, Urine 46 mg/dL   Protein Creatinine Ratio 0.10 0.00 - 0.15 mg/mg[Cre]  CBC     Status: Abnormal   Collection Time: 06/28/17  5:40 AM  Result Value Ref Range   WBC 10.8 (H) 4.0 - 10.5 K/uL   RBC  4.25 3.87 - 5.11 MIL/uL   Hemoglobin 11.3 (L) 12.0 - 15.0 g/dL   HCT 16.1 (L) 09.6 - 04.5 %   MCV 75.8 (L) 78.0 - 100.0 fL   MCH 26.6 26.0 - 34.0 pg   MCHC 35.1 30.0 - 36.0 g/dL   RDW 40.9 81.1 - 91.4 %   Platelets 276 150 - 400 K/uL  Comprehensive metabolic panel     Status: Abnormal   Collection Time: 06/28/17  5:40 AM  Result Value Ref Range   Sodium 139 135 - 145 mmol/L   Potassium 3.3 (L) 3.5 - 5.1 mmol/L   Chloride 102 101 - 111 mmol/L   CO2 25 22 - 32 mmol/L   Glucose, Bld 92 65 - 99 mg/dL   BUN 15 6 - 20 mg/dL   Creatinine, Ser 7.82 0.44 - 1.00 mg/dL   Calcium 8.9 8.9 - 10.3  mg/dL   Total Protein 8.0 6.5 - 8.1 g/dL   Albumin 3.7 3.5 - 5.0 g/dL   AST 22 15 - 41 U/L   ALT 18 14 - 54 U/L   Alkaline Phosphatase 55 38 - 126 U/L   Total Bilirubin 0.7 0.3 - 1.2 mg/dL   GFR calc non Af Amer >60 >60 mL/min   GFR calc Af Amer >60 >60 mL/min   Anion gap 12 5 - 15   MAU Course  Procedures  MDM Upon exam pt has hardened area on her right side of abdomen approx 5-6 cm. Ultrasound shows probable seroma. Abdominal incision visualized packing present. Percocet 5/325mg  x 2 given for pain and she developed itching shortly after. Benadryl  IVP given and pt has gotten relief and states she is feeling much better. B/P's are wnl and pih labs are normal. Sign out to Dr. Normand Sloop and she will assume care.  Assessment and Plan  Abnormal Postoperative incision Abdominal Pain Seroma CTHN  Ultrasound Percocet Benadryl PIH labs         Lori A Clemmons CNM 06/28/2017, 2:35 AM

## 2017-06-28 NOTE — Progress Notes (Signed)
Lab results called to [facility] CNM. CNM will discuss pt with Dr Normand Sloop. Pt aware

## 2017-07-14 ENCOUNTER — Other Ambulatory Visit: Payer: Self-pay | Admitting: Obstetrics and Gynecology

## 2017-07-14 ENCOUNTER — Ambulatory Visit (HOSPITAL_COMMUNITY): Payer: Managed Care, Other (non HMO)

## 2017-07-14 ENCOUNTER — Ambulatory Visit (HOSPITAL_COMMUNITY)
Admission: RE | Admit: 2017-07-14 | Discharge: 2017-07-14 | Disposition: A | Discharge status: Home / Self Care | Payer: Managed Care, Other (non HMO) | Source: Ambulatory Visit | Attending: Obstetrics and Gynecology | Admitting: Obstetrics and Gynecology

## 2017-07-14 DIAGNOSIS — L7634 Postprocedural seroma of skin and subcutaneous tissue following other procedure: Secondary | ICD-10-CM | POA: Insufficient documentation

## 2017-07-14 DIAGNOSIS — I97641 Postprocedural seroma of a circulatory system organ or structure following cardiac bypass: Secondary | ICD-10-CM

## 2018-01-19 ENCOUNTER — Other Ambulatory Visit: Payer: Self-pay | Admitting: Obstetrics and Gynecology

## 2018-01-19 DIAGNOSIS — N939 Abnormal uterine and vaginal bleeding, unspecified: Secondary | ICD-10-CM

## 2018-01-26 ENCOUNTER — Other Ambulatory Visit: Payer: Medicare Other

## 2018-01-28 ENCOUNTER — Ambulatory Visit
Admission: RE | Admit: 2018-01-28 | Discharge: 2018-01-28 | Disposition: A | Discharge status: Home / Self Care | Payer: Managed Care, Other (non HMO) | Source: Ambulatory Visit | Attending: Obstetrics and Gynecology | Admitting: Obstetrics and Gynecology

## 2018-01-28 DIAGNOSIS — N939 Abnormal uterine and vaginal bleeding, unspecified: Secondary | ICD-10-CM

## 2018-05-22 IMAGING — US US MFM OB FOLLOW-UP
1 series · 14 of 25 positions shown · non-contrast
Comparison: none

[Series 1: us mfm ob follow-up · 25 acquisitions, 14 frames shown]
[im 1/25]
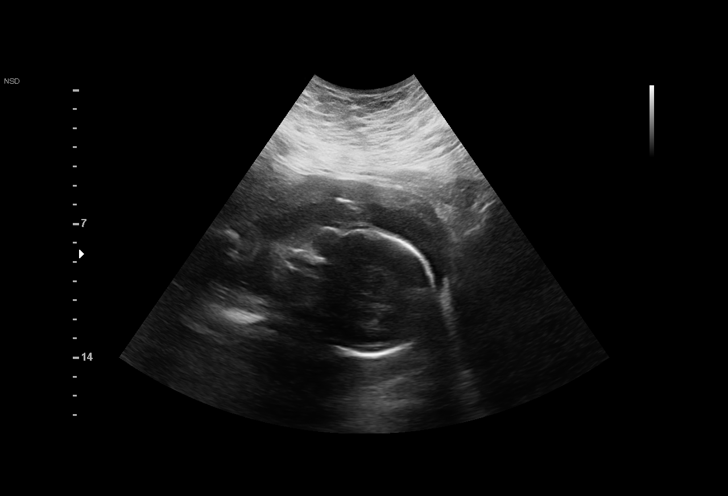
[im 3/25]
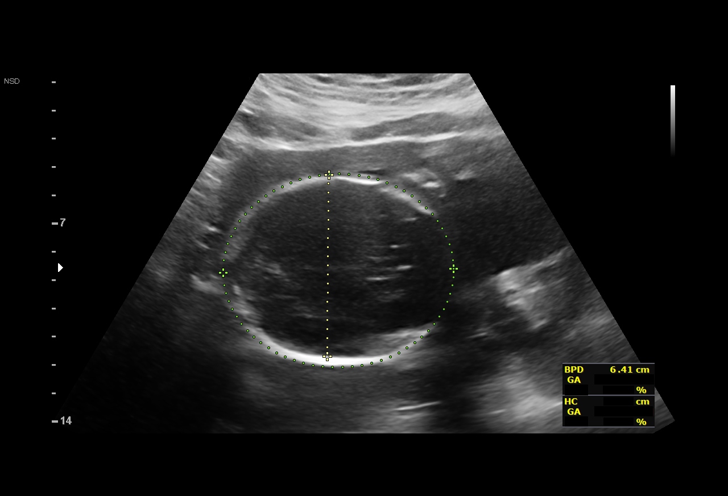
[im 5/25]
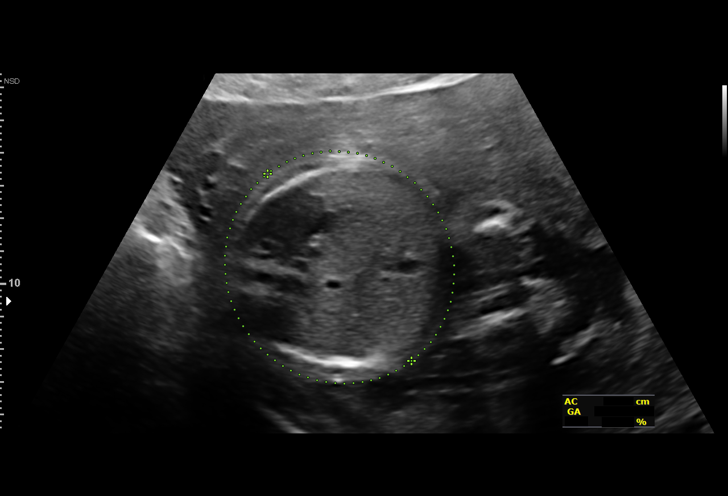
[im 7/25]
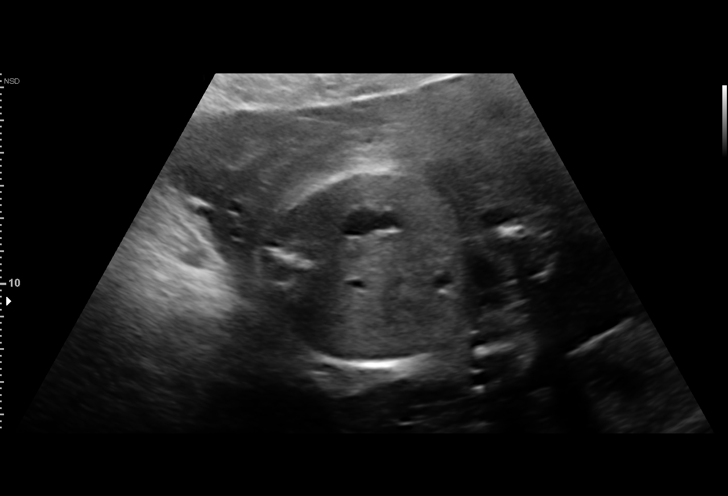
[im 9/25]
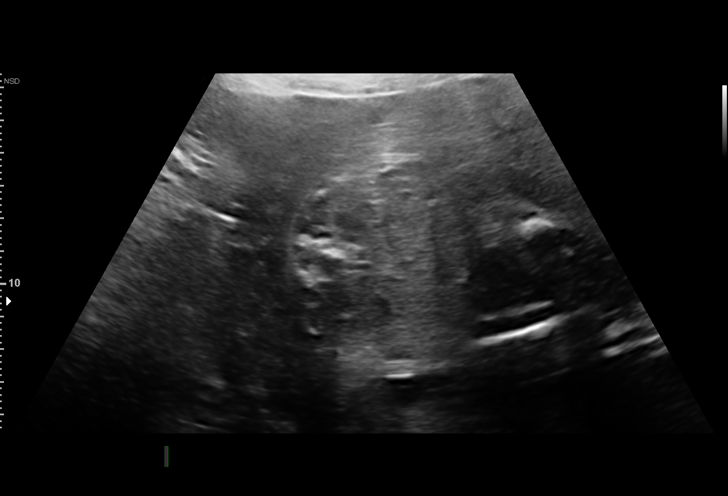
[im 10/25]
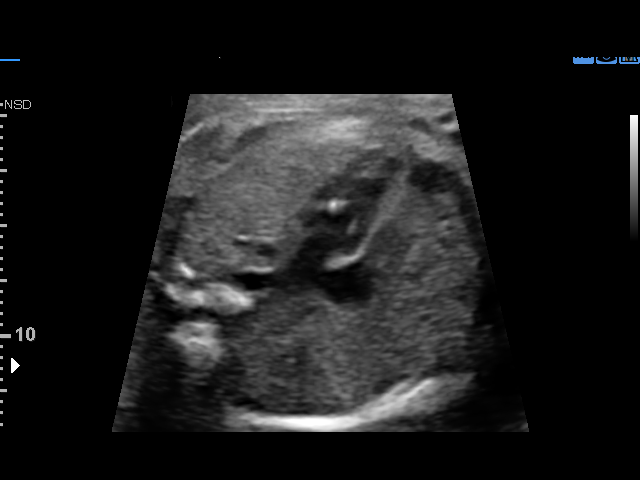
[im 12/25]
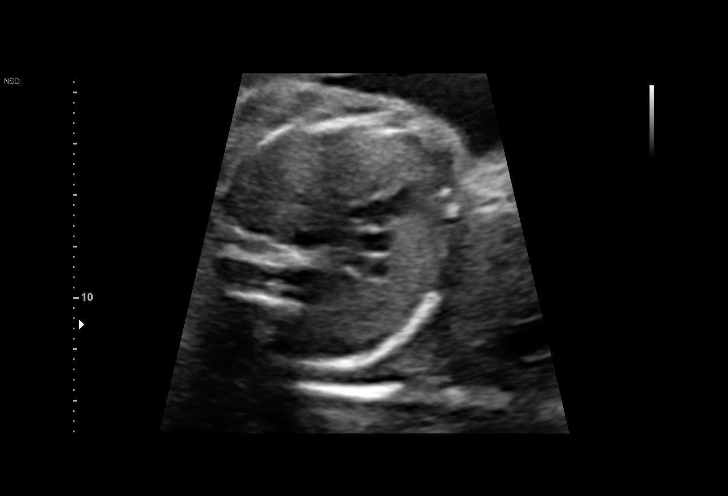
[im 14/25]
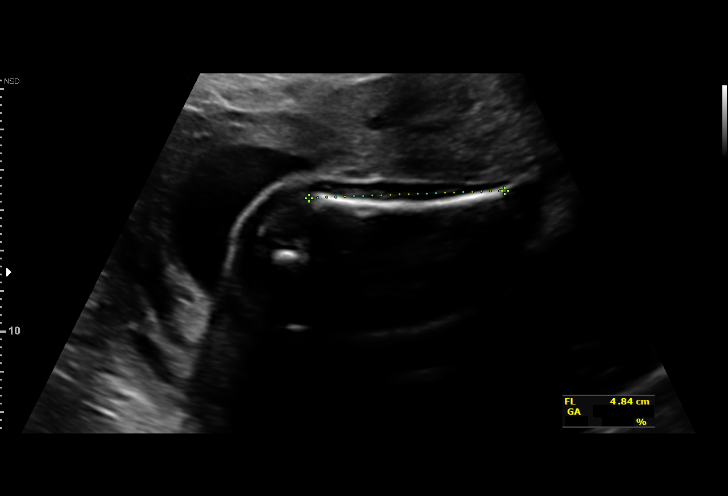
[im 16/25]
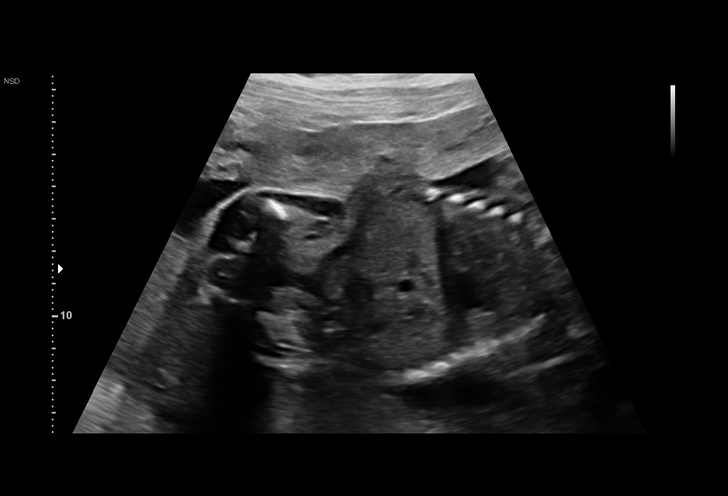
[im 17/25]
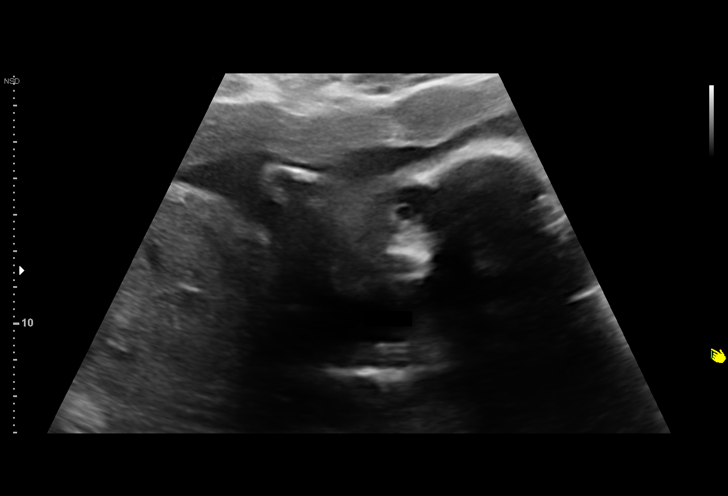
[im 19/25]
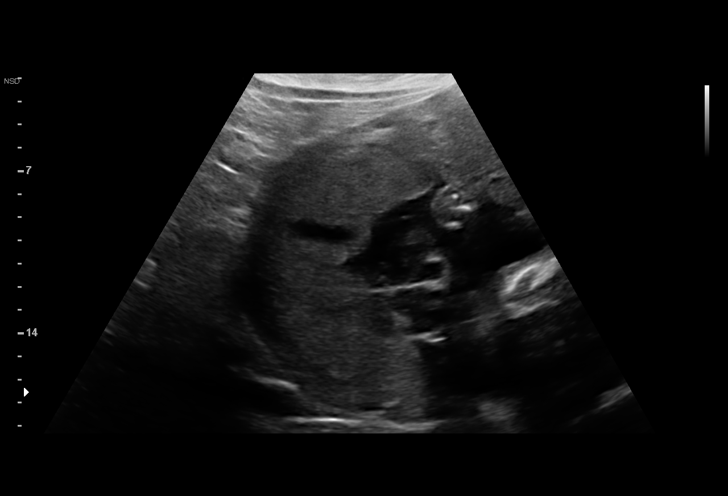
[im 21/25]
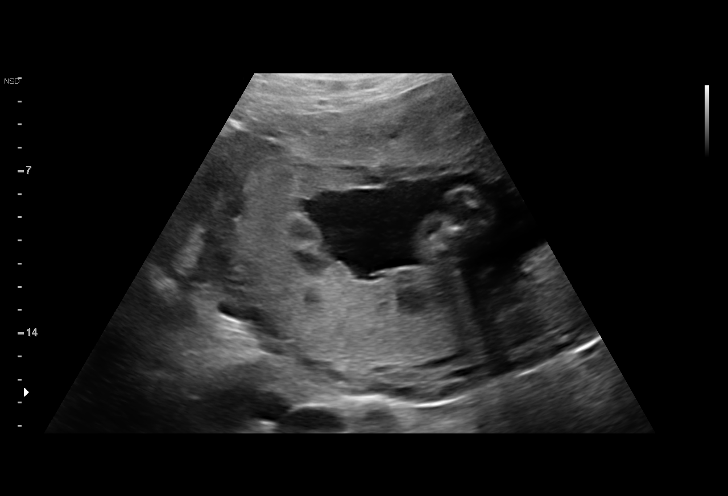
[im 23/25]
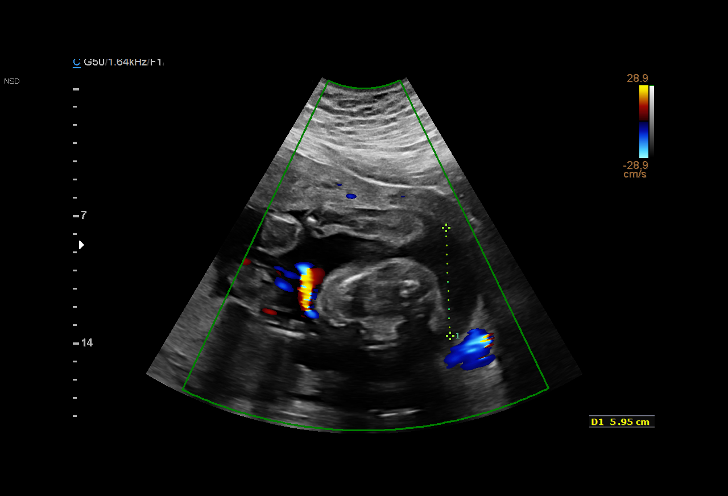
[im 25/25]
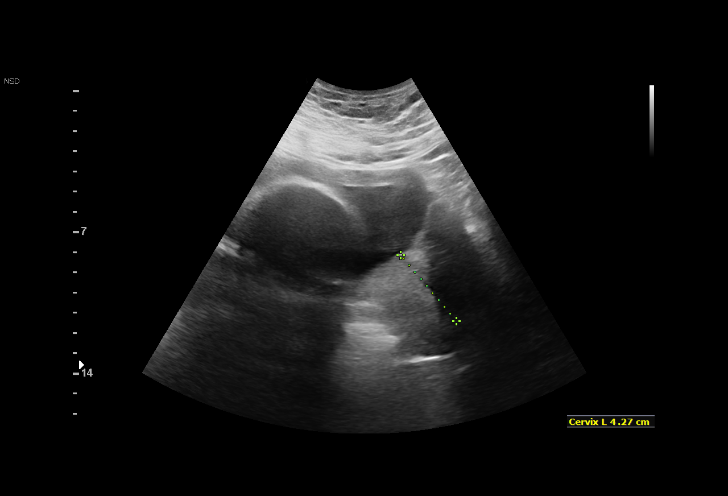

[14 of 25 positions shown; findings below may reference images not displayed]

Ave.,[HOSPITAL]

1  LINKAS LUC            [id number]      [id number]     [id number]
Indications

25 weeks gestation of pregnancy
Obesity complicating pregnancy, second
trimester (BMI 59)
Encounter for fetal anatomic survey
Hypothyroid
Hypertension - Chronic/Pre-existing
Advanced maternal age multigravida 35+,
second trimester; low risk NIPS
OB History

Gravidity:    2         Term:   1        Prem:   0        SAB:   0
TOP:          0       Ectopic:  0        Living: 1
Fetal Evaluation

Num Of Fetuses:     1
Fetal Heart         162
Rate(bpm):
Cardiac Activity:   Observed
Presentation:       Cephalic
Placenta:           Posterior, above cervical os
P. Cord Insertion:  Not well visualized

Amniotic Fluid
AFI FV:      Subjectively within normal limits

Largest Pocket(cm)
6
Biometry

BPD:      63.8  mm     G. Age:  25w 6d         39  %    CI:         73.1   %    70 - 86
FL/HC:      20.1   %    18.6 -
HC:      237.2  mm     G. Age:  25w 6d         24  %    HC/AC:      1.07        1.04 -
AC:      221.3  mm     G. Age:  26w 4d         63  %    FL/BPD:     74.8   %    71 - 87
FL:       47.7  mm     G. Age:  26w 0d         39  %    FL/AC:      21.6   %    20 - 24

Est. FW:     909  gm           2 lb     61  %
Gestational Age

LMP:           25w 6d        Date:  09/18/16                 EDD:   06/25/17
U/S Today:     26w 1d                                        EDD:   06/23/17
Best:          25w 6d     Det. By:  LMP  (09/18/16)          EDD:   06/25/17
Anatomy

Cranium:               Appears normal         Aortic Arch:            Previously seen
Cavum:                 Previously seen        Ductal Arch:            Not well visualized
Ventricles:            Previously seen        Diaphragm:              Appears normal
Choroid Plexus:        Previously seen        Stomach:                Appears normal, left
sided
Cerebellum:            Previously seen        Abdomen:                Appears normal
Posterior Fossa:       Previously seen        Abdominal Wall:         Previously seen
Nuchal Fold:           Not applicable (>20    Cord Vessels:           Previously seen
wks GA)
Face:                  Orbits prev; profile   Kidneys:                Previously seen
not well visualized
Lips:                  Previously seen        Bladder:                Appears normal
Thoracic:              Appears normal         Spine:                  Previously seen
Heart:                 Echogenic focus        Upper Extremities:      Previously seen
in LV
RVOT:                  Not well visualized    Lower Extremities:      Previously seen
LVOT:                  Not well visualized

Other:  Male gender previously seen. Technically difficult due to maternal
habitus and fetal position.
Cervix Uterus Adnexa

Cervix
Length:            4.3  cm.
Normal appearance by transabdominal scan.
Impression

SIUP at 93w7d, CHTN, morbid obesity
active fetus
EFW 61st%'le
no defects are seen
incidental note is made of a left intracardiac echogenic focus
(EIF)
low risk cell free fetal DNA (risk unchanged)
normal AFI
no previa

today's findings were discussed with the patient.  She
understands her risk for T21 remains low and is unchanged
after today's finding of EIF.  This requires no follow up or
medical treatment and is considered a variation of normal
anatomy in low risk genetic screening.
Recommendations

1. interval growth q4 weeks
2. begin antenatal testing at 32 weeks.

## 2018-08-07 IMAGING — US US MFM FETAL BPP W/O NON-STRESS
1 series · 13 of 28 positions shown · non-contrast
Comparison: none

[Series 1: us mfm fetal bpp w/o non-stress · 32 acquisitions, 13 frames shown]
[im 2/32]
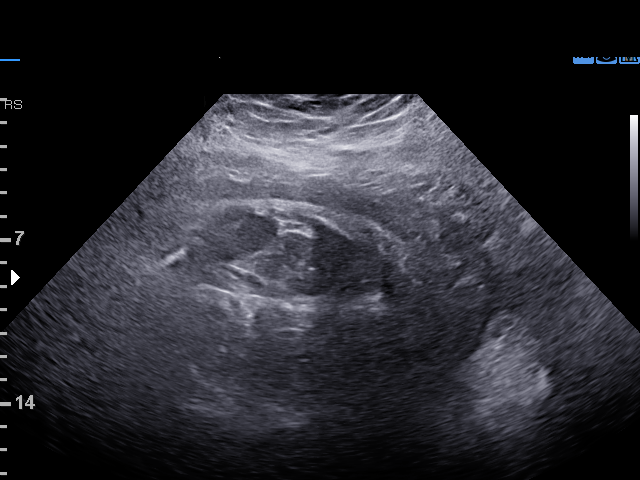
[im 4/32]
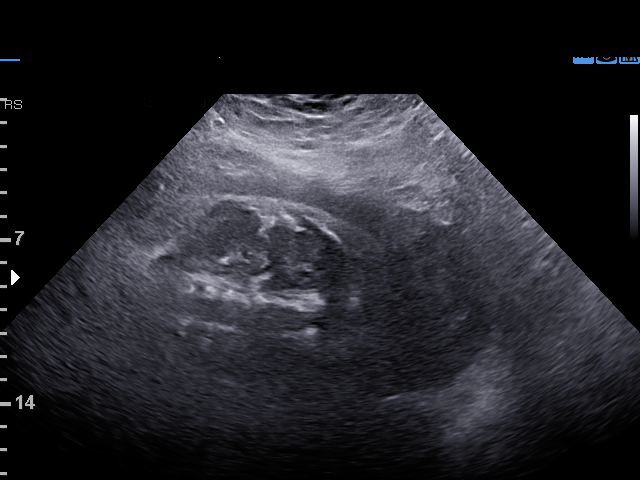
[im 6/32]
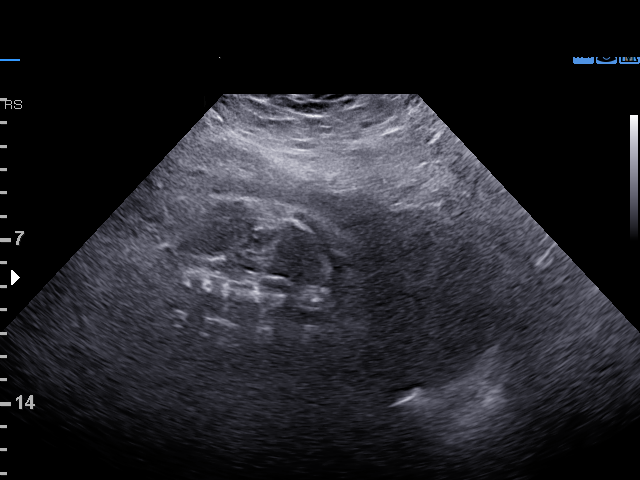
[im 9/32]
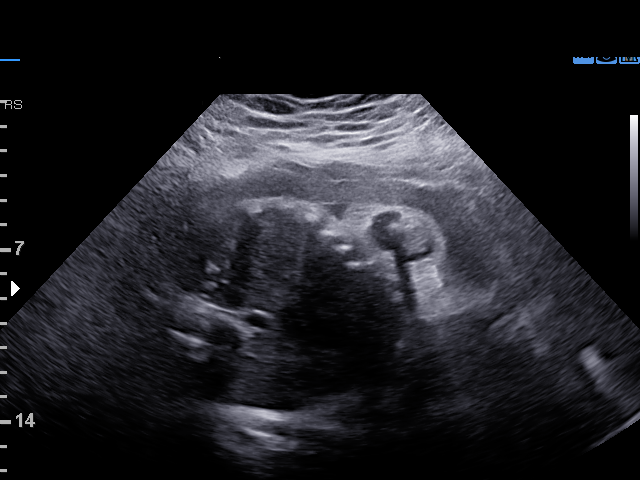
[im 11/32]
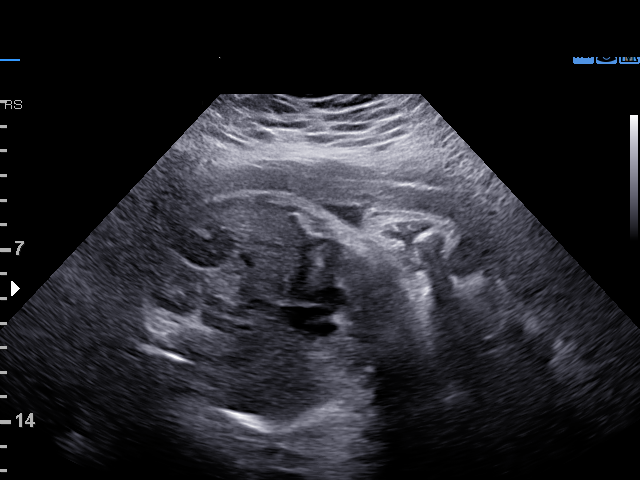
[im 13/32]
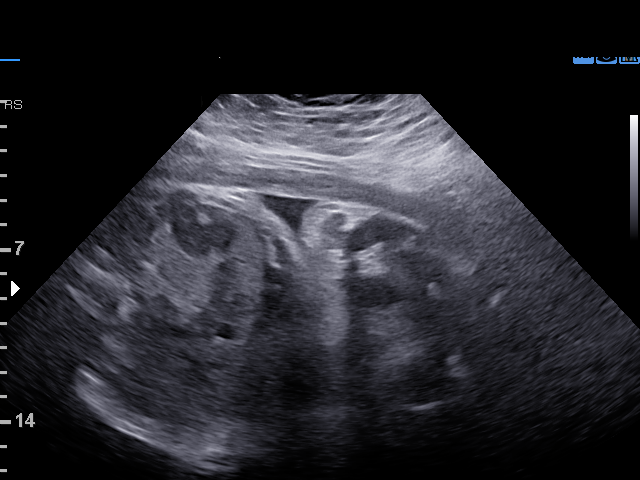
[im 17/32]
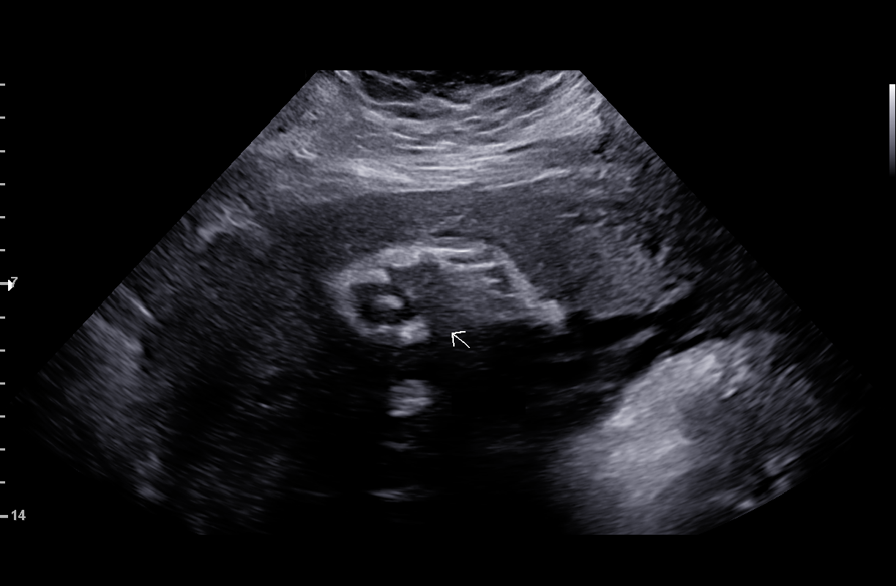
[im 19/32]
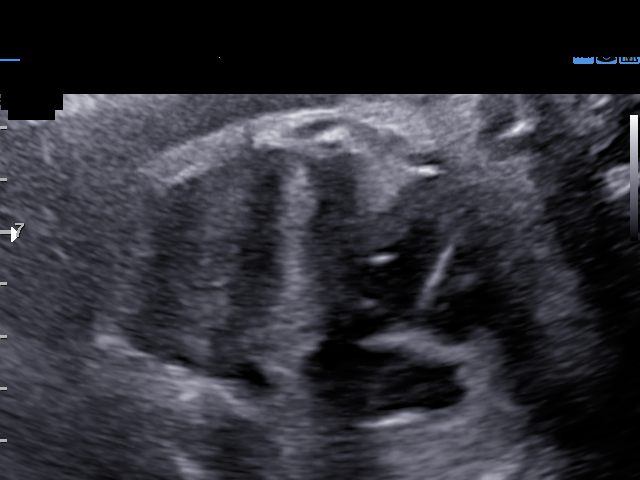
[im 21/32]
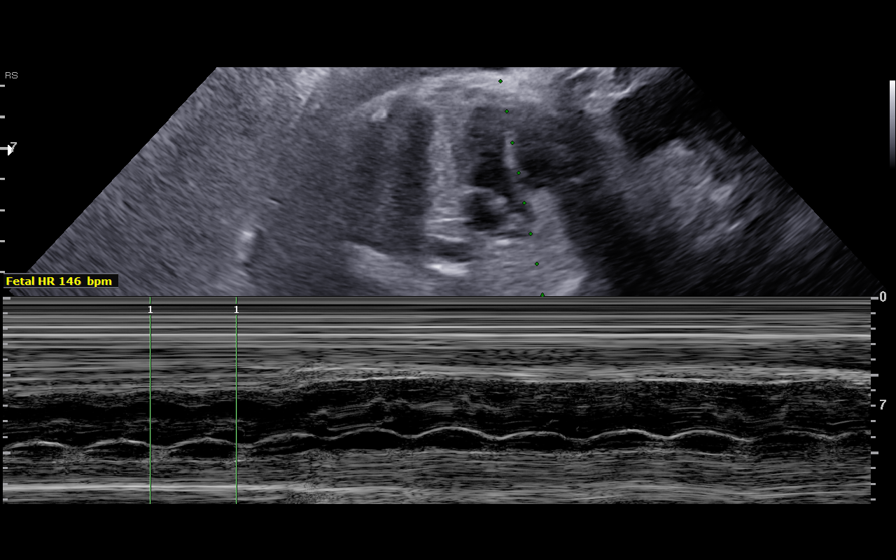
[im 23/32]
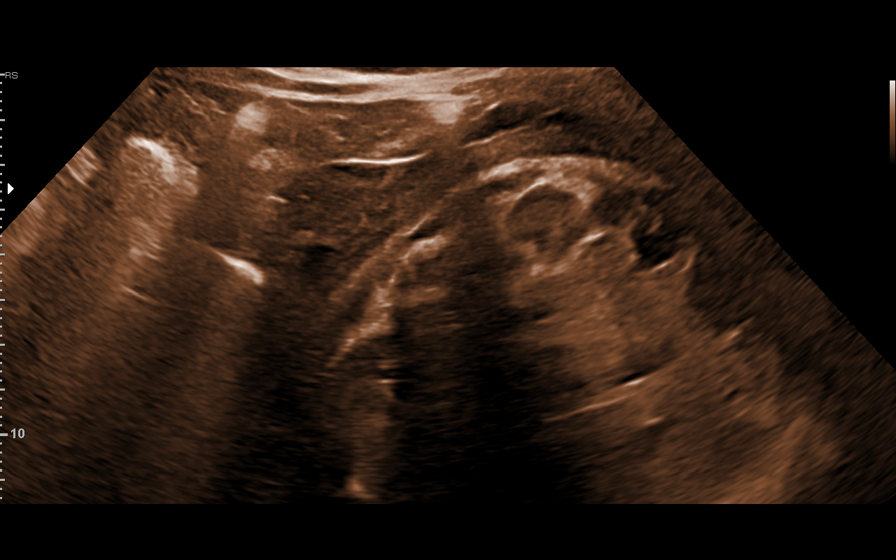
[im 26/32]
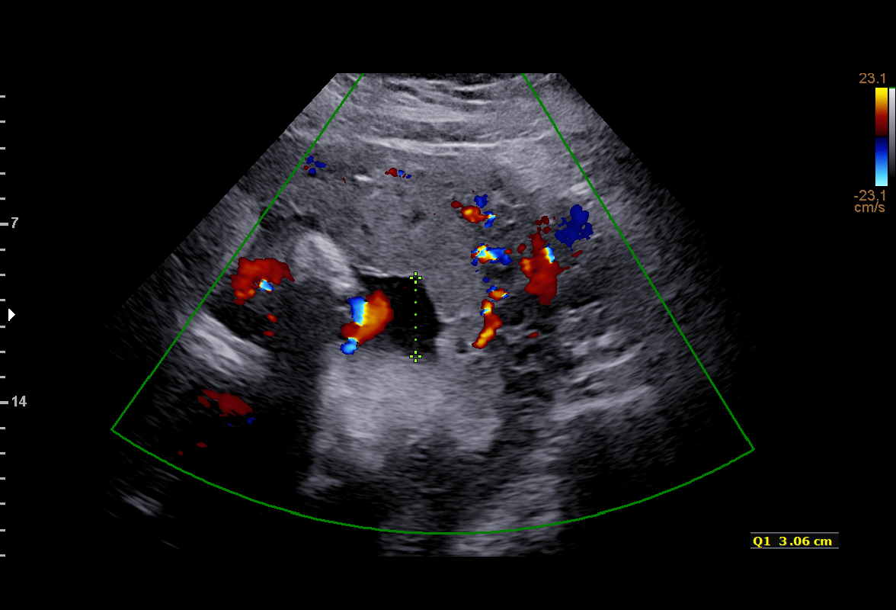
[im 28/32]
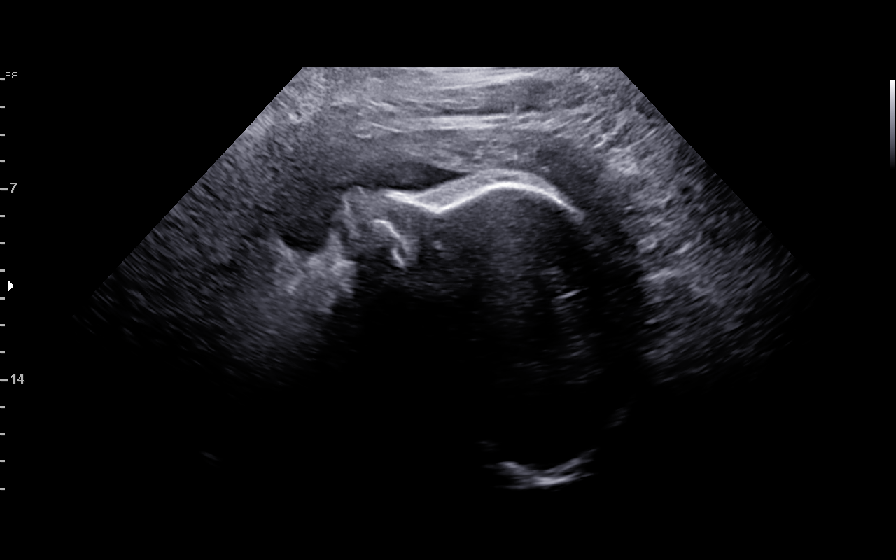
[im 30/32]
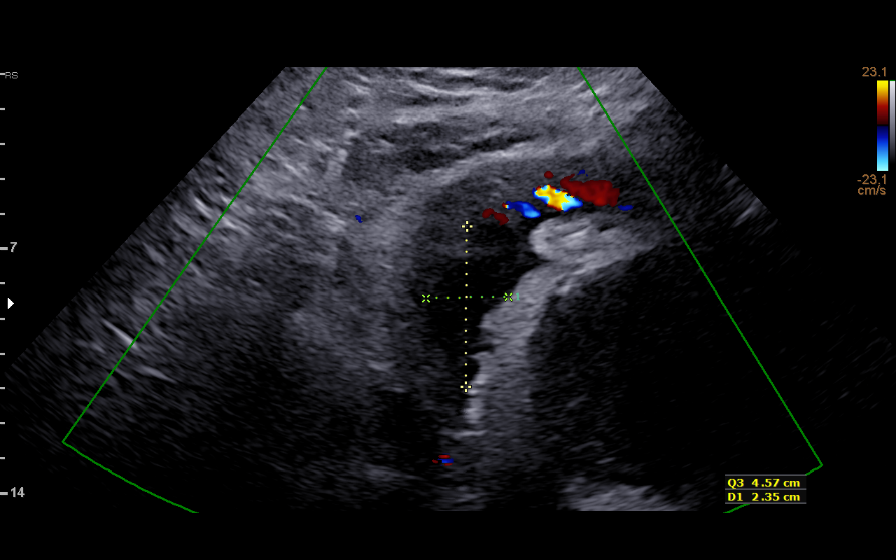

[13 of 28 positions shown; findings below may reference images not displayed]

Ave.,[HOSPITAL]

Indications

36 weeks gestation of pregnancy
Obesity complicating pregnancy, third
trimester(BMI 59)
Hypothyroid-levothyroxine
Hypertension - Chronic/Pre-existing-
procardia
Advanced maternal age multigravida (36)
third trimester-low risk NIPS
Previous cesarean delivery, antepartum
OB History

Gravidity:    2         Term:   1        Prem:   0        SAB:   0
TOP:          0       Ectopic:  0        Living: 1
Fetal Evaluation

Num Of Fetuses:     1
Fetal Heart         146
Rate(bpm):
Cardiac Activity:   Observed
Presentation:       Cephalic

Amniotic Fluid
AFI FV:      Subjectively within normal limits

AFI Sum(cm)     %Tile       Largest Pocket(cm)
13.5            50

RUQ(cm)       RLQ(cm)       LUQ(cm)        LLQ(cm)
3.2
Biophysical Evaluation

Amniotic F.V:   Pocket => 2 cm two         F. Tone:        Observed
planes
F. Movement:    Observed                   Score:          [DATE]
F. Breathing:   Observed
Gestational Age

LMP:           36w 6d        Date:  09/18/16                 EDD:   06/25/17
Best:          36w 6d     Det. By:  LMP  (09/18/16)          EDD:   06/25/17
Impression

Single living intrauterine pregnancy at 36 weeks 6 days.
Normal amniotic fluid volume.
BPP [DATE].
Recommendations

Continue weekly BPPs; repeat growth scan next week

## 2018-09-17 IMAGING — US US ABDOMEN LIMITED
1 series · 12 of 17 positions shown · non-contrast
Comparison: 06/28/2017.

CLINICAL DATA: Postoperative seroma after C-section. Continued
surveillance.

EXAM:
ULTRASOUND ABDOMEN LIMITED

[Series 1: us abdomen limited · 17 acquisitions, 12 frames shown]
[im 1/17]
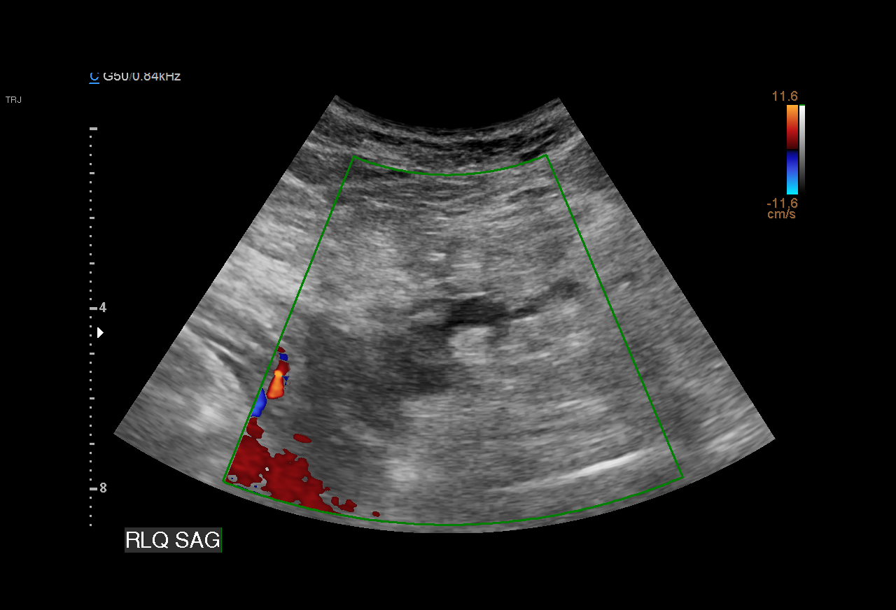
[im 3/17]
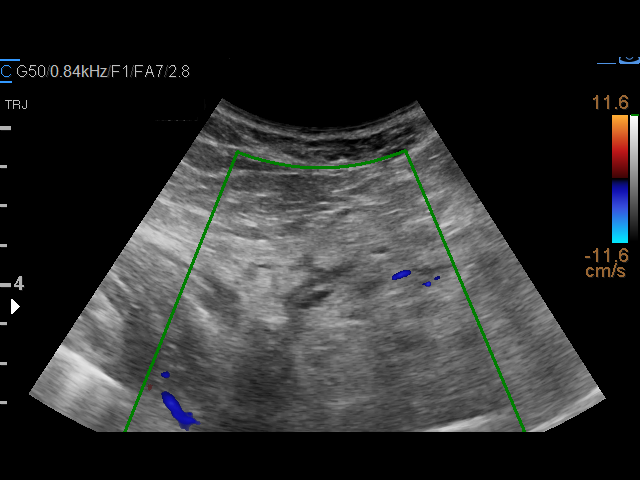
[im 4/17]
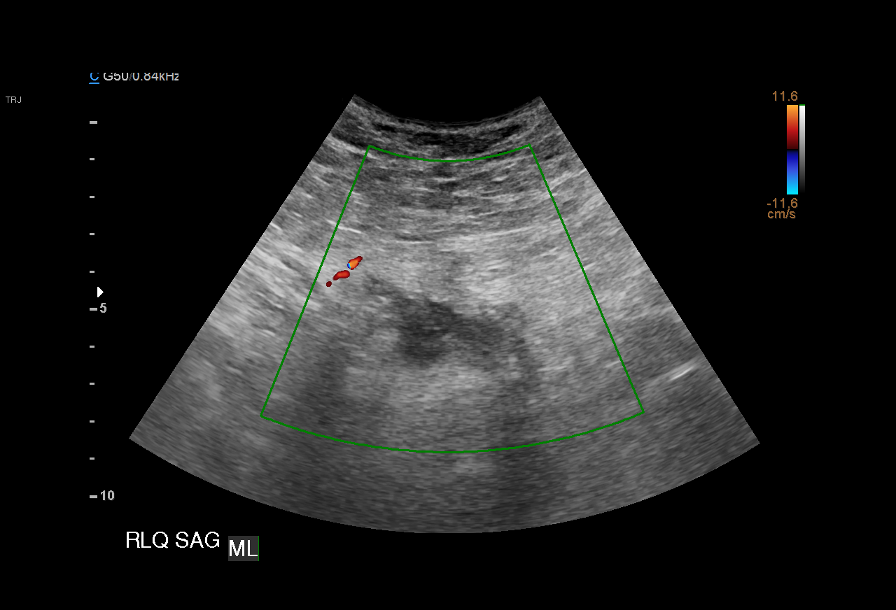
[im 5/17]
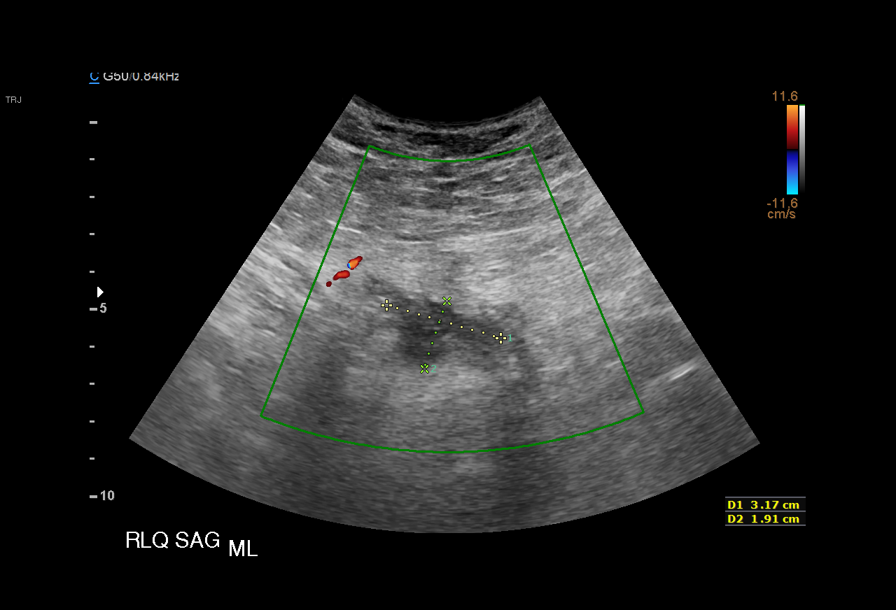
[im 7/17]
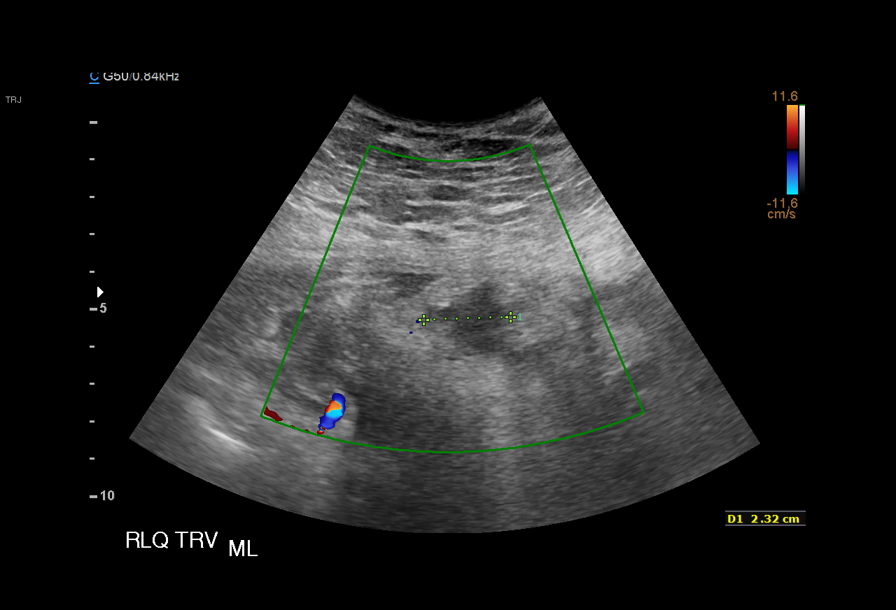
[im 8/17]
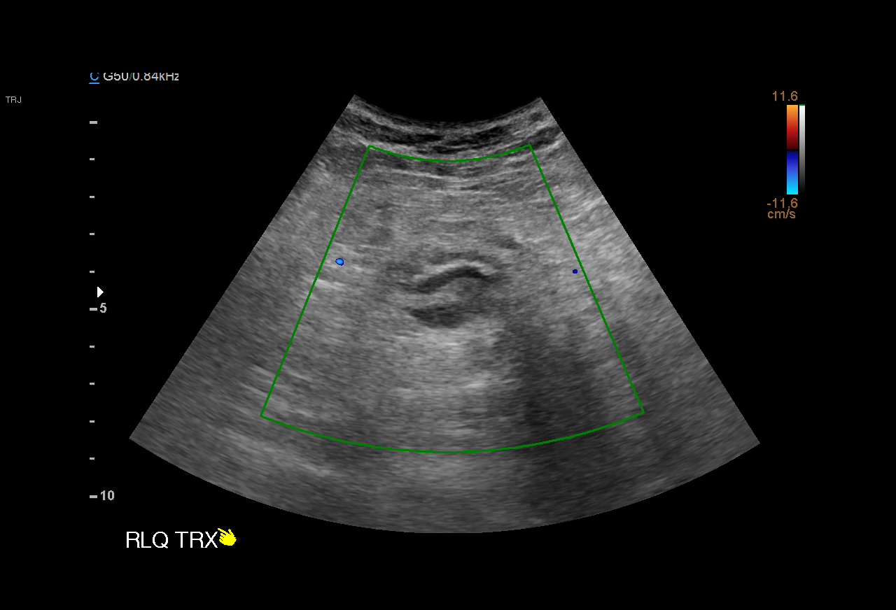
[im 10/17]
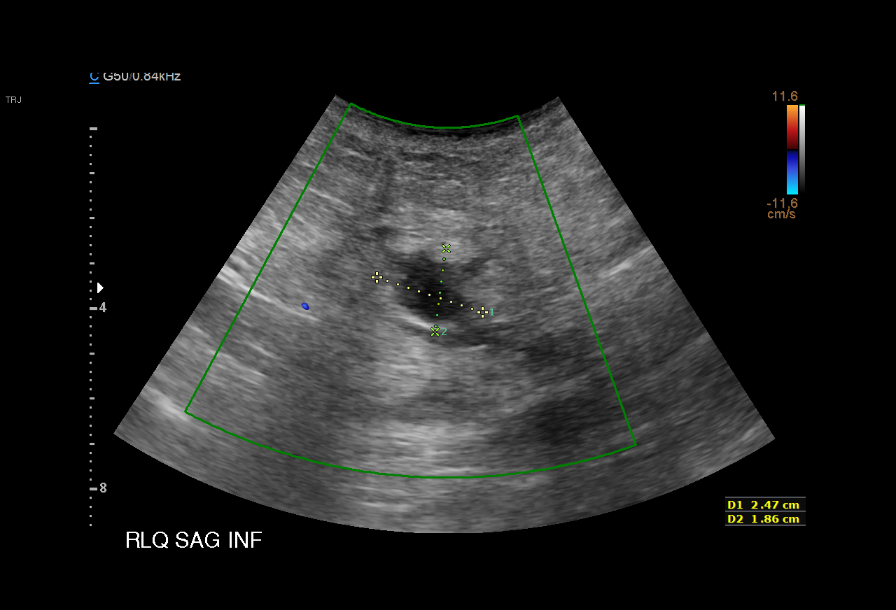
[im 11/17]
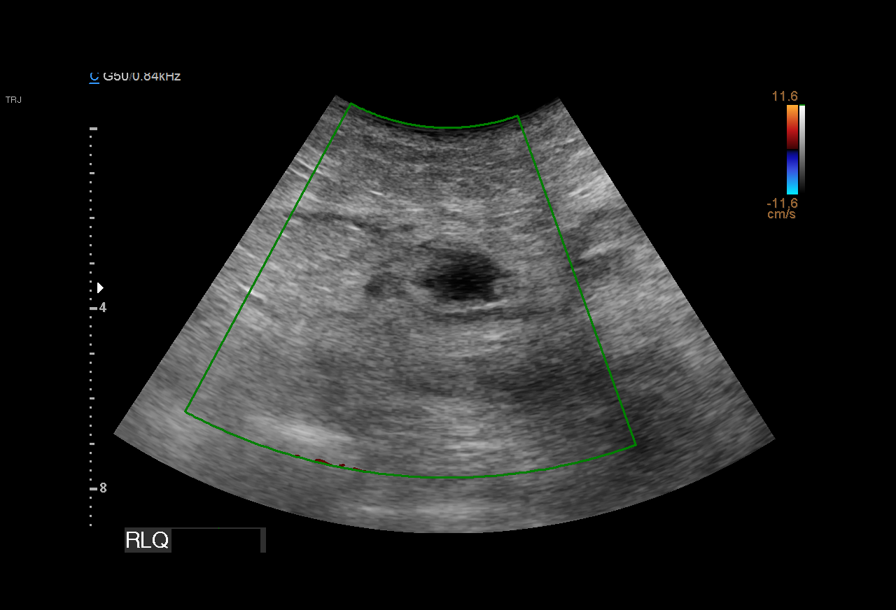
[im 13/17]
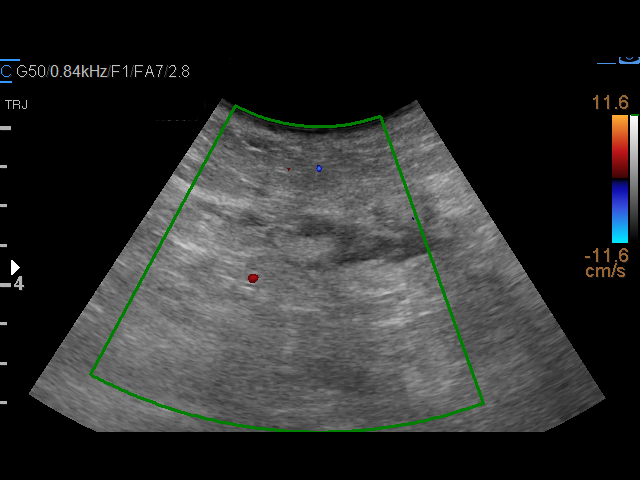
[im 14/17]
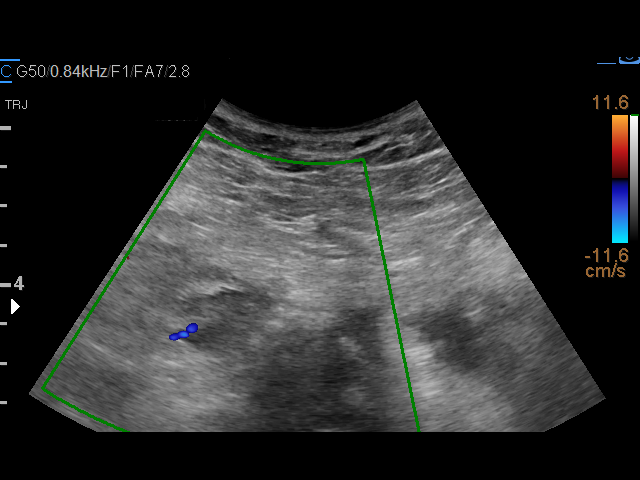
[im 15/17]
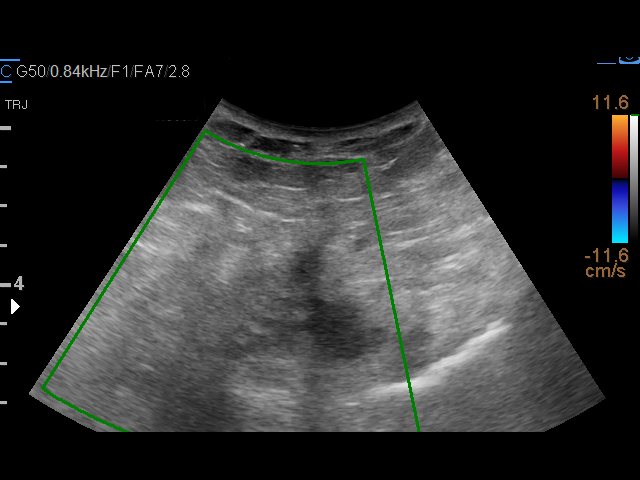
[im 17/17]
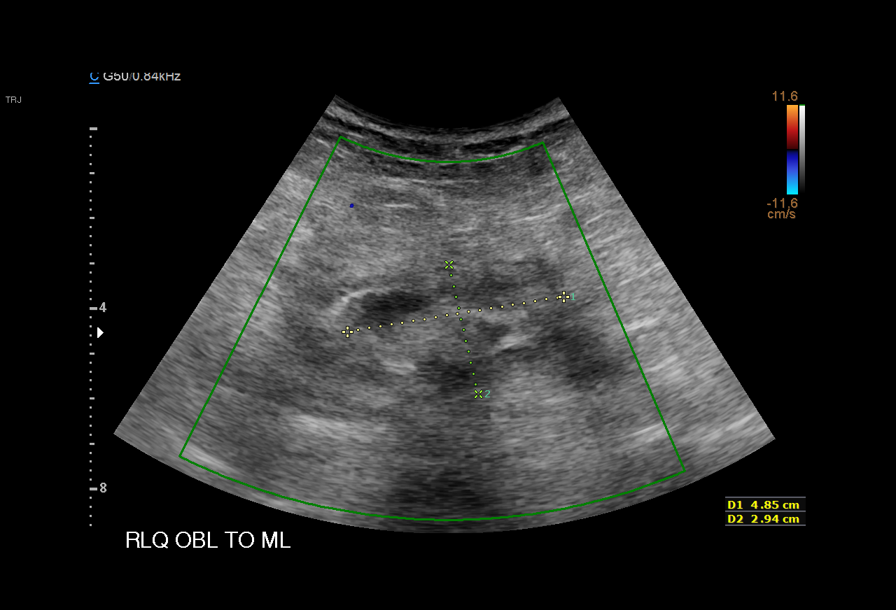

[12 of 17 positions shown; findings below may reference images not displayed]

FINDINGS: There is improvement in the previous complex fluid collections of
the anterior abdominal wall. These irregular collections extend from
the RIGHT lower quadrant midclavicular line towards the midline.
Both collections measure approximately 2 x 3 cm cross-section, but
there appears to be one or more irregular channels which establish
contiguity between the two collections.
IMPRESSION: Improved complex fluid collections in the anterior abdominal wall,
compared with prior sonography. If further investigation desired,
with regard to location and potential for percutaneous intervention,
CT scan of the abdomen and pelvis with contrast is recommended.

## 2018-10-15 ENCOUNTER — Other Ambulatory Visit (HOSPITAL_COMMUNITY)
Admission: RE | Admit: 2018-10-15 | Discharge: 2018-10-15 | Disposition: A | Discharge status: Home / Self Care | Payer: BC Managed Care – PPO | Source: Ambulatory Visit | Attending: Obstetrics and Gynecology | Admitting: Obstetrics and Gynecology

## 2018-10-15 ENCOUNTER — Other Ambulatory Visit: Payer: Self-pay | Admitting: Obstetrics and Gynecology

## 2018-10-15 DIAGNOSIS — Z124 Encounter for screening for malignant neoplasm of cervix: Secondary | ICD-10-CM | POA: Insufficient documentation

## 2018-10-18 LAB — CYTOLOGY - PAP
Diagnosis: NEGATIVE
HPV: NOT DETECTED

## 2019-03-28 ENCOUNTER — Other Ambulatory Visit: Payer: Self-pay | Admitting: Obstetrics and Gynecology

## 2019-04-07 ENCOUNTER — Other Ambulatory Visit: Payer: Self-pay | Admitting: Obstetrics and Gynecology

## 2019-04-07 DIAGNOSIS — N939 Abnormal uterine and vaginal bleeding, unspecified: Secondary | ICD-10-CM

## 2019-04-13 ENCOUNTER — Ambulatory Visit
Admission: RE | Admit: 2019-04-13 | Discharge: 2019-04-13 | Disposition: A | Discharge status: Home / Self Care | Payer: BC Managed Care – PPO | Source: Ambulatory Visit | Attending: Obstetrics and Gynecology | Admitting: Obstetrics and Gynecology

## 2019-04-13 DIAGNOSIS — N939 Abnormal uterine and vaginal bleeding, unspecified: Secondary | ICD-10-CM

## 2021-01-30 NOTE — H&P (Signed)
[name] [name] is an 40 y.o. [id number] who is admitted for Hysteroscopy with Dilation and Curettage with Myosure Polypectomy for AUB-P.  Patient reports multiple episodes of bleeding within weeks of each other. Prior to 09/2020, she had normal menses which were regular. She considered LNG-IUD placement at the time of procedure but unfortunately, bother of her insurances do not cover this.  Work-up: EMB (03/2019): Endometroid-type polyp, benign Pap: NILM/HRHPV negative (10/15/2018)  TVUS (04/13/2019): EXAM: TRANSABDOMINAL AND TRANSVAGINAL ULTRASOUND OF PELVIS   TECHNIQUE: Both transabdominal and transvaginal ultrasound examinations of the pelvis were performed. Transabdominal technique was performed for global imaging of the pelvis including uterus, ovaries, adnexal regions, and pelvic cul-de-sac. It was necessary to proceed with endovaginal exam following the transabdominal exam to visualize the endometrium.   COMPARISON:  01/28/2018   FINDINGS: Uterus   Measurements: 9.8 x 6.0 x 6.2 cm = volume: 191 mL. Heterogeneous echotexture. No discrete mass.   Endometrium   Thickness: 5 mm in thickness.  No focal abnormality visualized.   Right ovary   Measurements: Not visualized.  No adnexal mass seen.   Left ovary   Measurements: 1.7 x 2.6 x 3.1 cm = volume: 7.1 mL. Normal appearance/no adnexal mass.   Other findings   No abnormal free fluid.   IMPRESSION: Heterogeneous echotexture throughout the uterus without discrete mass. Normal endometrium thickness and appearance. If bleeding remains unresponsive to hormonal or medical therapy, sonohysterogram should be considered for focal lesion work-up. (Ref: Radiological Reasoning: Algorithmic Workup of Abnormal Vaginal Bleeding with Endovaginal Sonography and Sonohysterography. 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    Past Medical History:  Diagnosis Date   Hidradenitis    Hidradenitis    Hypertension    Hypothyroidism    Migraine    Morbid obesity (HCC)    MRSA (methicillin resistant Staphylococcus aureus)    Narcolepsy    Sleep apnea    Urinary tract infection    Vaginal Pap smear, abnormal    Vitamin D deficiency     Past Surgical History:  Procedure Laterality Date   AXILLARY HIDRADENITIS EXCISION     CARPAL TUNNEL RELEASE     CESAREAN SECTION     CESAREAN SECTION N/A 06/16/2017   Procedure: REPEAT CESAREAN SECTION;  Surgeon: Geryl Rankins, MD;  Location: [facility] BIRTHING SUITES;  Service: Obstetrics;  Laterality: N/A;  EDD 06/25/17   CHOLECYSTECTOMY     GANGLION CYST EXCISION     LAPAROSCOPIC GASTRIC BANDING     TONSILLECTOMY      Family History  Problem Relation Age of Onset   Hypertension Mother    Skin cancer Mother    Diabetes Father    Hypertension Father    Heart disease Father    Breast cancer Maternal Aunt    Hearing loss Brother     Social History:  reports that she has quit smoking. She has never used smokeless tobacco. She reports that she does not drink alcohol and does not use drugs.  ALLERGIES/MEDS:  Allergies:  Allergies  Allergen Reactions   Fish Allergy Anaphylaxis   Escitalopram Oxalate Hives   Penicillins Hives, Swelling and Other (See Comments)  Has patient had a PCN reaction causing immediate rash, facial/tongue/throat swelling, SOB or lightheadedness with hypotension: Unknown Has patient had a PCN reaction causing severe rash involving mucus membranes or skin necrosis: Unknown Has patient had a PCN reaction that required hospitalization: Unknown Has patient had a PCN reaction occurring within the last 10 years: No If  all of the above answers are "NO", then may proceed with Cephalosporin use.    Pineapple Other (See Comments)    Rash and tongue swells    No medications prior to admission.     Review of Systems  Constitutional: Negative.   HENT: Negative.    Eyes: Negative.   Respiratory: Negative.    Cardiovascular: Negative.   Gastrointestinal: Negative.   Genitourinary: Negative.   Musculoskeletal: Negative.   Skin: Negative.   Neurological: Negative.   Endo/Heme/Allergies: Negative.   Psychiatric/Behavioral: Negative.     currently breastfeeding. Gen:  NAD, pleasant and cooperative Cardio:  RRR Pulm:  CTAB, no wheezes/rales/rhonchi Abd:  Soft, non-distended, non-tender throughout, no rebound/guarding Ext:  No bilateral LE edema, no bilateral calf tenderness Pelvic (performed by Claris Che, FNP-C on 11/14/20): External genitalia - no lesions, normal, no erythema, urethral meatus - normal without lesions or discharge, vagina - normal pink mucosa without lesions or abnormal discharge, moderate blood, cervix - normal without lesions or discharge, no CMT, uterus - difficult to assess secondary to body habitus, mobile and nontender, adnexa - difficult to assess secondary to habitus, no masses or tenderness bilatearlly  No results found for this or any previous visit (from the past 24 hour(s)).  No results found.   ASSESSMENT/PLAN: [name] [name] is a 40 y.o. [id number] who is admitted for Hysteroscopy with Dilation and Curettage with Myosure Polypectomy for AUB-P.  - Admit to [facility] Main OR - Admit labs (CBC, T&S, COVID screen) - Diet:  ERAS pathway/per anesthesia - IVF:  Per anesthesia - VTE Prophylaxis:  SCDs - Antibiotics: None - D/C home same-day  Consents: I discussed with the patient that this surgery is performed to look inside the uterus and remove the uterine lining.  Prior to surgery, the risks and benefits of the surgery, as well as alternative treatments, have been discussed.   The risks include, but are not limited to bleeding, including the need for a blood transfusion, infection, damage to organs and tissues, including uterine perforation, requiring additional surgery, postoperative pain, short-term and long-term, failure of the procedure to control symptoms, need for hysterectomy to control bleeding, fluid overload, which could create electrolyte abnormalities and the need to stop the procedure before completion, inability to safely complete the procedure, deep vein thrombosis and/or pulmonary embolism, painful intercourse, complications the course of which cannot be predicted or prevented, and death.  Patient was consented for blood products.  The patient is aware that bleeding may result in the need for a blood transfusion which includes risk of transmission of HIV (1:2 million), Hepatitis C (1:2 million), and Hepatitis B (1:200 thousand) and transfusion reaction.  Patient voiced understanding of the above risks as well as understanding of indications for blood transfusion.  Steva Ready, DO

## 2021-02-04 NOTE — Progress Notes (Signed)
Surgical Instructions    Your procedure is scheduled on 02/11/21.  Report to [facility] Main Entrance "A" at 10:45 A.M., then check in with the Admitting office.  Call this number if you have problems the morning of surgery:  [phone]   If you have any questions prior to your surgery date call [phone]: Open Monday-Friday 8am-4pm    Remember:  Do not eat after midnight the night before your surgery  You may drink clear liquids until 9:45am the morning of your surgery.   Clear liquids allowed are: Water, Non-Citrus Juices (without pulp), Carbonated Beverages, Clear Tea, Black Coffee ONLY (NO MILK, CREAM OR POWDERED CREAMER of any kind), and Gatorade    Take these medicines the morning of surgery with A SIP OF WATER  buPROPion (WELLBUTRIN XL) labetalol (NORMODYNE) levothyroxine (SYNTHROID, LEVOTHROID) sertraline (ZOLOFT)  IF NEEDED: cyclobenzaprine (FLEXERIL)  SUMAtriptan (IMITREX)  As of today, STOP taking any Aspirin (unless otherwise instructed by your surgeon) Aleve, Naproxen, Ibuprofen, Motrin, Advil, Goody's, BC's, all herbal medications, fish oil, and all vitamins.     After your COVID test   You are not required to quarantine however you are required to wear a well-fitting mask when you are out and around people not in your household.  If your mask becomes wet or soiled, replace with a new one.  Wash your hands often with soap and water for 20 seconds or clean your hands with an alcohol-based hand sanitizer that contains at least 60% alcohol.  Do not share personal items.  Notify your provider: if you are in close contact with someone who has COVID  or if you develop a fever of 100.4 or greater, sneezing, cough, sore throat, shortness of breath or body aches.             Do not wear jewelry or makeup Do not wear lotions, powders, perfumes/colognes, or deodorant. Do not shave 48 hours prior to surgery.   Do not bring valuables to the hospital. DO Not  wear nail polish, gel polish, artificial nails, or any other type of covering on natural nails including finger and toenails. If patients have artificial nails, gel coating, etc. that need to be removed by a nail salon, please have this removed prior to surgery or surgery may need to be canceled/delayed if the surgeon/ anesthesia feels like the patient is unable to be adequately monitored.             [facility] is not responsible for any belongings or valuables.  Do NOT Smoke (Tobacco/Vaping)  24 hours prior to your procedure  If you use a CPAP at night, you may bring your mask for your overnight stay.   Contacts, glasses, hearing aids, dentures or partials may not be worn into surgery, please bring cases for these belongings   For patients admitted to the hospital, discharge time will be determined by your treatment team.   Patients discharged the day of surgery will not be allowed to drive home, and someone needs to stay with them for 24 hours.  NO VISITORS WILL BE ALLOWED IN PRE-OP WHERE PATIENTS ARE PREPPED FOR SURGERY.  ONLY 1 SUPPORT PERSON MAY BE PRESENT IN THE WAITING ROOM WHILE YOU ARE IN SURGERY.  IF YOU ARE TO BE ADMITTED, ONCE YOU ARE IN YOUR ROOM YOU WILL BE ALLOWED TWO (2) VISITORS. 1 (ONE) VISITOR MAY STAY OVERNIGHT BUT MUST ARRIVE TO THE ROOM BY 8pm.  Minor children may have two parents present. Special consideration for safety  and communication needs will be reviewed on a case by case basis.  Special instructions:    Oral Hygiene is also important to reduce your risk of infection.  Remember - BRUSH YOUR TEETH THE MORNING OF SURGERY WITH YOUR REGULAR TOOTHPASTE   [place]- Preparing For Surgery  Before surgery, you can play an important role. Because skin is not sterile, your skin needs to be as free of germs as possible. You can reduce the number of germs on your skin by washing with CHG (chlorahexidine gluconate) Soap before surgery.  CHG is an antiseptic cleaner which  kills germs and bonds with the skin to continue killing germs even after washing.     Please do not use if you have an allergy to CHG or antibacterial soaps. If your skin becomes reddened/irritated stop using the CHG.  Do not shave (including legs and underarms) for at least 48 hours prior to first CHG shower. It is OK to shave your face.  Please follow these instructions carefully.     Shower the NIGHT BEFORE SURGERY and the MORNING OF SURGERY with CHG Soap.   If you chose to wash your hair, wash your hair first as usual with your normal shampoo. After you shampoo, rinse your hair and body thoroughly to remove the shampoo.  Then [employer] and genitals (private parts) with your normal soap and rinse thoroughly to remove soap.  After that Use CHG Soap as you would any other liquid soap. You can apply CHG directly to the skin and wash gently with a scrungie or a clean washcloth.   Apply the CHG Soap to your body ONLY FROM THE NECK DOWN.  Do not use on open wounds or open sores. Avoid contact with your eyes, ears, mouth and genitals (private parts). Wash Face and genitals (private parts)  with your normal soap.   Wash thoroughly, paying special attention to the area where your surgery will be performed.  Thoroughly rinse your body with warm water from the neck down.  DO NOT shower/wash with your normal soap after using and rinsing off the CHG Soap.  Pat yourself dry with a CLEAN TOWEL.  Wear CLEAN PAJAMAS to bed the night before surgery  Place CLEAN SHEETS on your bed the night before your surgery  DO NOT SLEEP WITH PETS.   Day of Surgery: Take a shower with CHG soap. Wear Clean/Comfortable clothing the morning of surgery Do not apply any deodorants/lotions.   Remember to brush your teeth WITH YOUR REGULAR TOOTHPASTE.   Please read over the following fact sheets that you were given.

## 2021-02-05 ENCOUNTER — Encounter (HOSPITAL_COMMUNITY): Payer: Self-pay

## 2021-02-05 ENCOUNTER — Other Ambulatory Visit: Payer: Self-pay

## 2021-02-05 ENCOUNTER — Encounter (HOSPITAL_COMMUNITY)
Admission: RE | Admit: 2021-02-05 | Discharge: 2021-02-05 | Disposition: A | Discharge status: Home / Self Care | Payer: Medicare HMO | Source: Ambulatory Visit | Attending: Obstetrics and Gynecology | Admitting: Obstetrics and Gynecology

## 2021-02-05 VITALS — BP 140/95 | HR 103 | Temp 98.4°F | Resp 19 | Ht 69.0 in | Wt 353.1 lb

## 2021-02-05 DIAGNOSIS — Z01818 Encounter for other preprocedural examination: Secondary | ICD-10-CM | POA: Insufficient documentation

## 2021-02-05 LAB — CBC
HCT: 37.8 % (ref 36.0–46.0)
Hemoglobin: 13.1 g/dL (ref 12.0–15.0)
MCH: 26.6 pg (ref 26.0–34.0)
MCHC: 34.7 g/dL (ref 30.0–36.0)
MCV: 76.7 fL — ABNORMAL LOW (ref 80.0–100.0)
Platelets: 252 10*3/uL (ref 150–400)
RBC: 4.93 MIL/uL (ref 3.87–5.11)
RDW: 14.3 % (ref 11.5–15.5)
WBC: 10.9 10*3/uL — ABNORMAL HIGH (ref 4.0–10.5)
nRBC: 0 % (ref 0.0–0.2)

## 2021-02-05 LAB — SURGICAL PCR SCREEN
MRSA, PCR: NEGATIVE
Staphylococcus aureus: NEGATIVE

## 2021-02-05 LAB — BASIC METABOLIC PANEL
Anion gap: 9 (ref 5–15)
BUN: 9 mg/dL (ref 6–20)
CO2: 25 mmol/L (ref 22–32)
Calcium: 9 mg/dL (ref 8.9–10.3)
Chloride: 102 mmol/L (ref 98–111)
Creatinine, Ser: 0.92 mg/dL (ref 0.44–1.00)
GFR, Estimated: >60 mL/min (ref >60)
Glucose, Bld: 100 mg/dL — ABNORMAL HIGH (ref 70–99)
Potassium: 3.6 mmol/L (ref 3.5–5.1)
Sodium: 136 mmol/L (ref 135–145)

## 2021-02-05 LAB — TYPE AND SCREEN
ABO/RH(D): B POS
Antibody Screen: NEGATIVE

## 2021-02-05 NOTE — Progress Notes (Signed)
PCP - Jean Rosenthal, [place] Cardiologist - denies  PPM/ICD - denies   Chest x-ray - n/a EKG - 02/05/21 Stress Test - denies ECHO - denies Cardiac Cath - denies  Sleep Study - +OSA CPAP - wears most nights, autotitrates  NO diabetes    As of today, STOP taking any Aspirin (unless otherwise instructed by your surgeon) Aleve, Naproxen, Ibuprofen, Motrin, Advil, Goody's, BC's, all herbal medications, fish oil, and all vitamins.  ERAS Protcol -yes PRE-SURGERY Ensure or G2- none ordered  COVID TEST- none. Ambulatory surgery   Anesthesia review: no  Patient denies shortness of breath, fever, cough and chest pain at PAT appointment   All instructions explained to the patient, with a verbal understanding of the material. Patient agrees to go over the instructions while at home for a better understanding. Patient also instructed to self quarantine after being tested for COVID-19. The opportunity to ask questions was provided.

## 2021-02-05 NOTE — Progress Notes (Signed)
Called Dr. Connye Burkitt to request pre op orders.

## 2021-02-11 ENCOUNTER — Ambulatory Visit (HOSPITAL_COMMUNITY)
Admission: RE | Admit: 2021-02-11 | Discharge: 2021-02-11 | Disposition: A | Discharge status: Home / Self Care | Payer: Medicare HMO | Source: Ambulatory Visit | Attending: Obstetrics and Gynecology | Admitting: Obstetrics and Gynecology

## 2021-02-11 ENCOUNTER — Ambulatory Visit (HOSPITAL_COMMUNITY): Payer: Medicare HMO | Admitting: Certified Registered Nurse Anesthetist

## 2021-02-11 ENCOUNTER — Other Ambulatory Visit: Payer: Self-pay

## 2021-02-11 ENCOUNTER — Encounter (HOSPITAL_COMMUNITY)
Admission: RE | Disposition: A | Discharge status: Home / Self Care | Payer: Self-pay | Source: Ambulatory Visit | Attending: Obstetrics and Gynecology

## 2021-02-11 ENCOUNTER — Encounter (HOSPITAL_COMMUNITY): Payer: Self-pay | Admitting: Obstetrics and Gynecology

## 2021-02-11 DIAGNOSIS — N840 Polyp of corpus uteri: Secondary | ICD-10-CM | POA: Diagnosis not present

## 2021-02-11 DIAGNOSIS — N939 Abnormal uterine and vaginal bleeding, unspecified: Secondary | ICD-10-CM | POA: Insufficient documentation

## 2021-02-11 HISTORY — PX: DILATATION & CURETTAGE/HYSTEROSCOPY WITH MYOSURE: SHX6511

## 2021-02-11 LAB — POCT PREGNANCY, URINE: Preg Test, Ur: NEGATIVE

## 2021-02-11 SURGERY — DILATATION & CURETTAGE/HYSTEROSCOPY WITH MYOSURE
Anesthesia: General | Site: Uterus

## 2021-02-11 MED ORDER — ROCURONIUM BROMIDE 10 MG/ML (PF) SYRINGE
PREFILLED_SYRINGE | INTRAVENOUS | Status: AC
  Filled 2021-02-11: qty 10

## 2021-02-11 MED ORDER — OXYCODONE HCL 5 MG PO TABS: 5.0000 mg | ORAL_TABLET | Freq: Once | ORAL | Status: DC | PRN

## 2021-02-11 MED ORDER — LACTATED RINGERS IV SOLN: INTRAVENOUS | Status: DC | PRN

## 2021-02-11 MED ORDER — PROPOFOL 10 MG/ML IV BOLUS
INTRAVENOUS | Status: DC | PRN
  Administered 2021-02-11: 100 mg via INTRAVENOUS
  Administered 2021-02-11: 200 mg via INTRAVENOUS
  Administered 2021-02-11: 100 mg via INTRAVENOUS

## 2021-02-11 MED ORDER — ONDANSETRON HCL 4 MG/2ML IJ SOLN: 4.0000 mg | Freq: Four times a day (QID) | INTRAMUSCULAR | Status: DC | PRN

## 2021-02-11 MED ORDER — PHENYLEPHRINE HCL (PRESSORS) 10 MG/ML IV SOLN
INTRAVENOUS | Status: DC | PRN
  Administered 2021-02-11: 80 ug via INTRAVENOUS

## 2021-02-11 MED ORDER — EPHEDRINE 5 MG/ML INJ
INTRAVENOUS | Status: AC
  Filled 2021-02-11: qty 5

## 2021-02-11 MED ORDER — MIDAZOLAM HCL 2 MG/2ML IJ SOLN
INTRAMUSCULAR | Status: AC
  Filled 2021-02-11: qty 2

## 2021-02-11 MED ORDER — ONDANSETRON HCL 4 MG/2ML IJ SOLN
INTRAMUSCULAR | Status: AC
  Filled 2021-02-11: qty 4

## 2021-02-11 MED ORDER — MIDAZOLAM HCL 5 MG/5ML IJ SOLN
INTRAMUSCULAR | Status: DC | PRN
  Administered 2021-02-11: 2 mg via INTRAVENOUS

## 2021-02-11 MED ORDER — FENTANYL CITRATE (PF) 100 MCG/2ML IJ SOLN
25.0000 ug | INTRAMUSCULAR | Status: DC | PRN
  Administered 2021-02-11: 14:00:00 50 ug via INTRAVENOUS

## 2021-02-11 MED ORDER — DEXAMETHASONE SODIUM PHOSPHATE 10 MG/ML IJ SOLN
INTRAMUSCULAR | Status: DC | PRN
  Administered 2021-02-11: 5 mg via INTRAVENOUS

## 2021-02-11 MED ORDER — SILVER NITRATE-POT NITRATE 75-25 % EX MISC
CUTANEOUS | Status: DC | PRN
  Administered 2021-02-11 (×4): 1

## 2021-02-11 MED ORDER — FENTANYL CITRATE (PF) 250 MCG/5ML IJ SOLN
INTRAMUSCULAR | Status: DC | PRN
  Administered 2021-02-11: 100 ug via INTRAVENOUS

## 2021-02-11 MED ORDER — ONDANSETRON HCL 4 MG/2ML IJ SOLN
INTRAMUSCULAR | Status: DC | PRN
  Administered 2021-02-11: 4 mg via INTRAVENOUS

## 2021-02-11 MED ORDER — SODIUM CHLORIDE 0.9 % IR SOLN
Status: DC | PRN
  Administered 2021-02-11: 3000 mL

## 2021-02-11 MED ORDER — CHLORHEXIDINE GLUCONATE 0.12 % MT SOLN
15.0000 mL | Freq: Once | OROMUCOSAL | Status: AC
  Administered 2021-02-11: 11:00:00 15 mL via OROMUCOSAL
  Filled 2021-02-11: qty 15

## 2021-02-11 MED ORDER — DEXAMETHASONE SODIUM PHOSPHATE 10 MG/ML IJ SOLN
INTRAMUSCULAR | Status: AC
  Filled 2021-02-11: qty 2

## 2021-02-11 MED ORDER — LIDOCAINE 2% (20 MG/ML) 5 ML SYRINGE
INTRAMUSCULAR | Status: DC | PRN
  Administered 2021-02-11: 100 mg via INTRAVENOUS

## 2021-02-11 MED ORDER — IBUPROFEN 800 MG PO TABS: 800.0000 mg | ORAL_TABLET | Freq: Three times a day (TID) | ORAL | 0 refills | Status: AC | PRN

## 2021-02-11 MED ORDER — OXYCODONE HCL 5 MG/5ML PO SOLN: 5.0000 mg | Freq: Once | ORAL | Status: DC | PRN

## 2021-02-11 MED ORDER — HYDROCORTISONE SOD SUC (PF) 250 MG IJ SOLR
INTRAMUSCULAR | Status: AC
  Filled 2021-02-11: qty 250

## 2021-02-11 MED ORDER — FENTANYL CITRATE (PF) 250 MCG/5ML IJ SOLN
INTRAMUSCULAR | Status: AC
  Filled 2021-02-11: qty 5

## 2021-02-11 MED ORDER — FENTANYL CITRATE (PF) 100 MCG/2ML IJ SOLN
INTRAMUSCULAR | Status: AC
  Filled 2021-02-11: qty 2

## 2021-02-11 MED ORDER — ORAL CARE MOUTH RINSE: 15.0000 mL | Freq: Once | OROMUCOSAL | Status: AC

## 2021-02-11 MED ORDER — PROPOFOL 10 MG/ML IV BOLUS
INTRAVENOUS | Status: AC
  Filled 2021-02-11: qty 20

## 2021-02-11 MED ORDER — LACTATED RINGERS IV SOLN: INTRAVENOUS | Status: DC

## 2021-02-11 MED ORDER — LIDOCAINE 2% (20 MG/ML) 5 ML SYRINGE
INTRAMUSCULAR | Status: AC
  Filled 2021-02-11: qty 5

## 2021-02-11 MED ORDER — SUCCINYLCHOLINE CHLORIDE 200 MG/10ML IV SOSY
PREFILLED_SYRINGE | INTRAVENOUS | Status: AC
  Filled 2021-02-11: qty 10

## 2021-02-11 SURGICAL SUPPLY — 19 items
CANISTER SUCT 3000ML PPV (MISCELLANEOUS) ×3 IMPLANT
CATH ROBINSON RED A/P 16FR (CATHETERS) ×3 IMPLANT
DEVICE MYOSURE LITE (MISCELLANEOUS) IMPLANT
DEVICE MYOSURE REACH (MISCELLANEOUS) ×2 IMPLANT
DILATOR CANAL MILEX (MISCELLANEOUS) IMPLANT
DRSG TELFA 3X8 NADH (GAUZE/BANDAGES/DRESSINGS) ×3 IMPLANT
GLOVE SURG ENC MOIS LTX SZ6.5 (GLOVE) ×3 IMPLANT
GLOVE SURG ENC TEXT LTX SZ6.5 (GLOVE) ×3 IMPLANT
GLOVE SURG UNDER POLY LF SZ7 (GLOVE) ×3 IMPLANT
GOWN STRL REUS W/ TWL LRG LVL3 (GOWN DISPOSABLE) ×2 IMPLANT
GOWN STRL REUS W/TWL LRG LVL3 (GOWN DISPOSABLE) ×4
KIT PROCEDURE FLUENT (KITS) ×3 IMPLANT
KIT TURNOVER KIT B (KITS) ×3 IMPLANT
PACK VAGINAL MINOR WOMEN LF (CUSTOM PROCEDURE TRAY) ×3 IMPLANT
PAD DRESSING TELFA 3X8 NADH (GAUZE/BANDAGES/DRESSINGS) IMPLANT
PAD OB MATERNITY 4.3X12.25 (PERSONAL CARE ITEMS) ×3 IMPLANT
SEAL ROD LENS SCOPE MYOSURE (ABLATOR) ×3 IMPLANT
TOWEL GREEN STERILE FF (TOWEL DISPOSABLE) ×6 IMPLANT
UNDERPAD 30X36 HEAVY ABSORB (UNDERPADS AND DIAPERS) ×3 IMPLANT

## 2021-02-11 NOTE — Interval H&P Note (Signed)
History and Physical Interval Note:  02/11/2021 12:36 PM  [name] [name]  has presented today for surgery, with the diagnosis of Endometrail Polyp.  The various methods of treatment have been discussed with the patient and family. After consideration of risks, benefits and other options for treatment, the patient has consented to  Procedure(s) with comments: DILATATION & CURETTAGE/HYSTEROSCOPY WITH MYOSURE POLYPECTOMY (N/A) - rep will cover confirmed on 02/04/21 CS as a surgical intervention.  The patient's history has been reviewed, patient examined, no change in status, stable for surgery.  I have reviewed the patient's chart and labs.  Questions were answered to the patient's satisfaction.     Steva Ready

## 2021-02-11 NOTE — Op Note (Signed)
Pre Op Dx:   1. Abnormal uterine bleeding 2. Endometrial polyp on endometrial biopsy  Post Op Dx: 1. Abnormal uterine bleeding 2. Endometrial polyp  Procedure:   Hysteroscopy with Dilation and Curettage and Myosure polypectomy   Surgeon:  Dr. Steva Ready Assistants:  None Anesthesia:  General   EBL:  5cc  IVF:  650cc UOP:  Scant with in and out foley catheter Fluid Deficit:  345cc   Drains:  None Specimen removed:  Endometrial curettings and endometrial polyp - sent to pathology Device(s) implanted: None Case Type:  Clean-contaminated Findings:  Normal-appearing cervix. Endometrium proliferative-appearing (due to start menses tomorrow). Small endometrial polyp on posterior wall. Bilateral tubal ostia visualized. Complications: None Indications:  40 y.o. [id number] with abnormal uterine bleeding and endometrial polyp detected on endometrial biopsy.  Description of each procedure:  After informed consent was obtained the patient was taken to the operating room in the dorsal supine position.  After administration of general anesthesia, the patient was placed in the dorsal lithotomy position and prepped and draped in the usual sterile fashion.  Her bladder was emptied using an in and out catheter.  A pre-operative time-out was completed.  The anterior lip of the cervix was grasped with a single-tooth tenaculum and the cervix was serially dilated to accommodate the hysteroscope.  The hysteroscope was advanced and the findings as above was noted. The Myosure Reach was used to resect the proliferative endometrial and endometrial polyp.  A sharp banjo curette was used to curettage the endometrium. The single-tooth tenaculum was removed and its sites were made hemostatic.  Adequate hemostasis was noted.  The patient was awakened and extubated and appeared to have tolerated the procedure well.  All counts were correct. Disposition:  PACU  Steva Ready, DO

## 2021-02-11 NOTE — Transfer of Care (Signed)
Immediate Anesthesia Transfer of Care Note  Patient: [name] [name]  Procedure(s) Performed: DILATATION & CURETTAGE/HYSTEROSCOPY WITH MYOSURE POLYPECTOMY (Uterus)  Patient Location: PACU  Anesthesia Type:General  Level of Consciousness: awake, alert , patient cooperative and responds to stimulation  Airway & Oxygen Therapy: Patient Spontanous Breathing and Patient connected to face mask oxygen  Post-op Assessment: Report given to RN and Post -op Vital signs reviewed and stable  Post vital signs: Reviewed and stable  Last Vitals:  Vitals Value Taken Time  BP    Temp    Pulse 99 02/11/21 1410  Resp 24 02/11/21 1410  SpO2 92 % 02/11/21 1410  Vitals shown include unvalidated device data.  Last Pain:  Vitals:   02/11/21 1054  TempSrc:   PainSc: 0-No pain      Patients Stated Pain Goal: 0 (02/11/21 1054)  Complications: No notable events documented.

## 2021-02-11 NOTE — Anesthesia Procedure Notes (Signed)
Procedure Name: LMA Insertion Date/Time: 02/11/2021 1:32 PM Performed by: Margarita Rana, CRNA Pre-anesthesia Checklist: Patient identified, Emergency Drugs available, Suction available and Patient being monitored Patient Re-evaluated:Patient Re-evaluated prior to induction Oxygen Delivery Method: Circle System Utilized Preoxygenation: Pre-oxygenation with 100% oxygen Induction Type: IV induction Ventilation: Mask ventilation without difficulty LMA: LMA inserted LMA Size: 4.0 Number of attempts: 1 Airway Equipment and Method: Bite block Placement Confirmation: positive ETCO2 Tube secured with: Tape Dental Injury: Teeth and Oropharynx as per pre-operative assessment

## 2021-02-11 NOTE — Anesthesia Preprocedure Evaluation (Signed)
Anesthesia Evaluation  Patient identified by MRN, date of birth, ID band Patient awake    Reviewed: Allergy & Precautions, H&P , NPO status , Patient's Chart, lab work & pertinent test results  Airway Mallampati: II   Neck ROM: full    Dental   Pulmonary sleep apnea , Current Smoker,    breath sounds clear to auscultation       Cardiovascular hypertension,  Rhythm:regular Rate:Normal     Neuro/Psych  Headaches,    GI/Hepatic   Endo/Other  Hypothyroidism Morbid obesity  Renal/GU      Musculoskeletal   Abdominal   Peds  Hematology   Anesthesia Other Findings   Reproductive/Obstetrics                             Anesthesia Physical Anesthesia Plan  ASA: 2  Anesthesia Plan: General   Post-op Pain Management:    Induction: Intravenous  PONV Risk Score and Plan: 2 and Ondansetron, Dexamethasone, Midazolam and Treatment may vary due to age or medical condition  Airway Management Planned: LMA  Additional Equipment:   Intra-op Plan:   Post-operative Plan: Extubation in OR  Informed Consent: I have reviewed the patients History and Physical, chart, labs and discussed the procedure including the risks, benefits and alternatives for the proposed anesthesia with the patient or authorized representative who has indicated his/her understanding and acceptance.     Dental advisory given  Plan Discussed with: Anesthesiologist, CRNA and Surgeon  Anesthesia Plan Comments:         Anesthesia Quick Evaluation

## 2021-02-12 ENCOUNTER — Encounter (HOSPITAL_COMMUNITY): Payer: Self-pay | Admitting: Obstetrics and Gynecology

## 2021-02-13 LAB — SURGICAL PATHOLOGY

## 2021-02-13 NOTE — Anesthesia Postprocedure Evaluation (Signed)
Anesthesia Post Note  Patient: [name] [name]  Procedure(s) Performed: DILATATION & CURETTAGE/HYSTEROSCOPY WITH MYOSURE POLYPECTOMY (Uterus)     Patient location during evaluation: PACU Anesthesia Type: General Level of consciousness: awake and alert Pain management: pain level controlled Vital Signs Assessment: post-procedure vital signs reviewed and stable Respiratory status: spontaneous breathing, nonlabored ventilation, respiratory function stable and patient connected to nasal cannula oxygen Cardiovascular status: blood pressure returned to baseline and stable Postop Assessment: no apparent nausea or vomiting Anesthetic complications: no   No notable events documented.  Last Vitals:  Vitals:   02/11/21 1425 02/11/21 1442  BP: (!) 141/96 128/80  Pulse: 73 74  Resp: 16 17  Temp:  36.8 C  SpO2: 93% 95%    Last Pain:  Vitals:   02/11/21 1442  TempSrc:   PainSc: 4    Pain Goal: Patients Stated Pain Goal: 0 (02/11/21 1054)                 Zymere Patlan S

## 2023-07-29 ENCOUNTER — Other Ambulatory Visit: Payer: Self-pay | Admitting: Nurse Practitioner

## 2023-07-29 DIAGNOSIS — N946 Dysmenorrhea, unspecified: Secondary | ICD-10-CM

## 2023-07-29 DIAGNOSIS — N852 Hypertrophy of uterus: Secondary | ICD-10-CM

## 2023-07-31 ENCOUNTER — Other Ambulatory Visit

## 2023-08-03 ENCOUNTER — Other Ambulatory Visit

## 2023-08-04 ENCOUNTER — Ambulatory Visit
Admission: RE | Admit: 2023-08-04 | Discharge: 2023-08-04 | Disposition: A | Discharge status: Home / Self Care | Source: Ambulatory Visit | Attending: Nurse Practitioner | Admitting: Nurse Practitioner

## 2023-08-04 DIAGNOSIS — N852 Hypertrophy of uterus: Secondary | ICD-10-CM

## 2023-08-04 DIAGNOSIS — N946 Dysmenorrhea, unspecified: Secondary | ICD-10-CM
